# Patient Record
Sex: Female | Born: 1965 | Hispanic: Yes | Marital: Married | Smoking: Never smoker
Health system: Southern US, Community
[De-identification: ages and names within clinical notes are randomized; demographics above are authoritative.]

## PROBLEM LIST (undated history)

## (undated) DIAGNOSIS — J45909 Unspecified asthma, uncomplicated: Secondary | ICD-10-CM

## (undated) HISTORY — PX: EYE SURGERY: SHX253

## (undated) HISTORY — PX: HEMORROIDECTOMY: SUR656

---

## 2014-03-27 DIAGNOSIS — K649 Unspecified hemorrhoids: Secondary | ICD-10-CM | POA: Insufficient documentation

## 2014-04-28 ENCOUNTER — Ambulatory Visit: Payer: Self-pay | Admitting: Family Medicine

## 2014-04-28 IMAGING — MG MM DIGITAL SCREENING BILAT W/ TOMO W/ CAD
8 series · 8 of 8 positions shown · non-contrast
Comparison: None.

CLINICAL DATA: Screening.

EXAM:
DIGITAL SCREENING BILATERAL MAMMOGRAM WITH 3D TOMO WITH CAD

[R MLO]
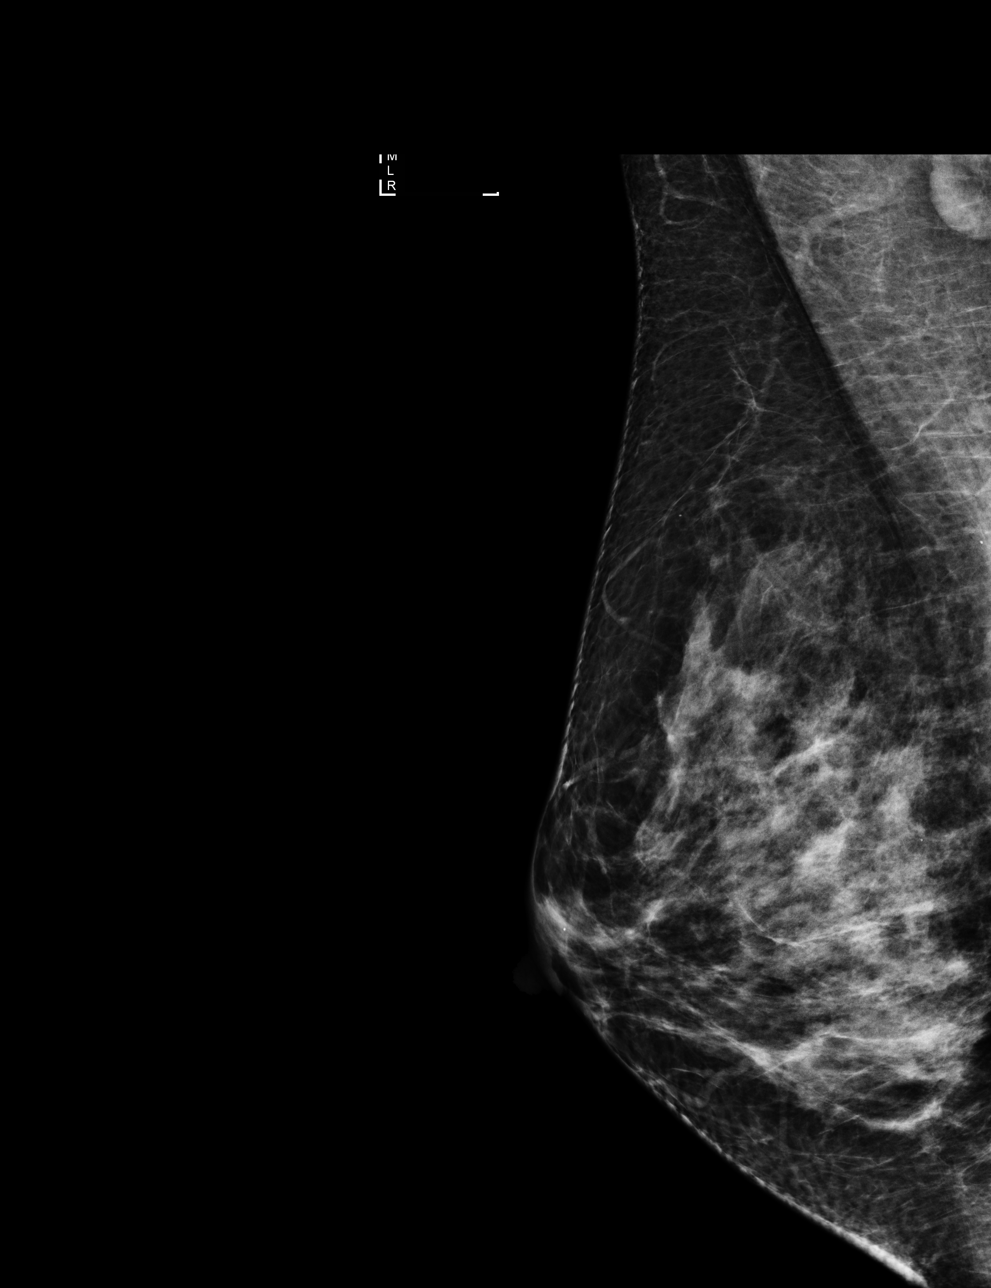

[R CC]
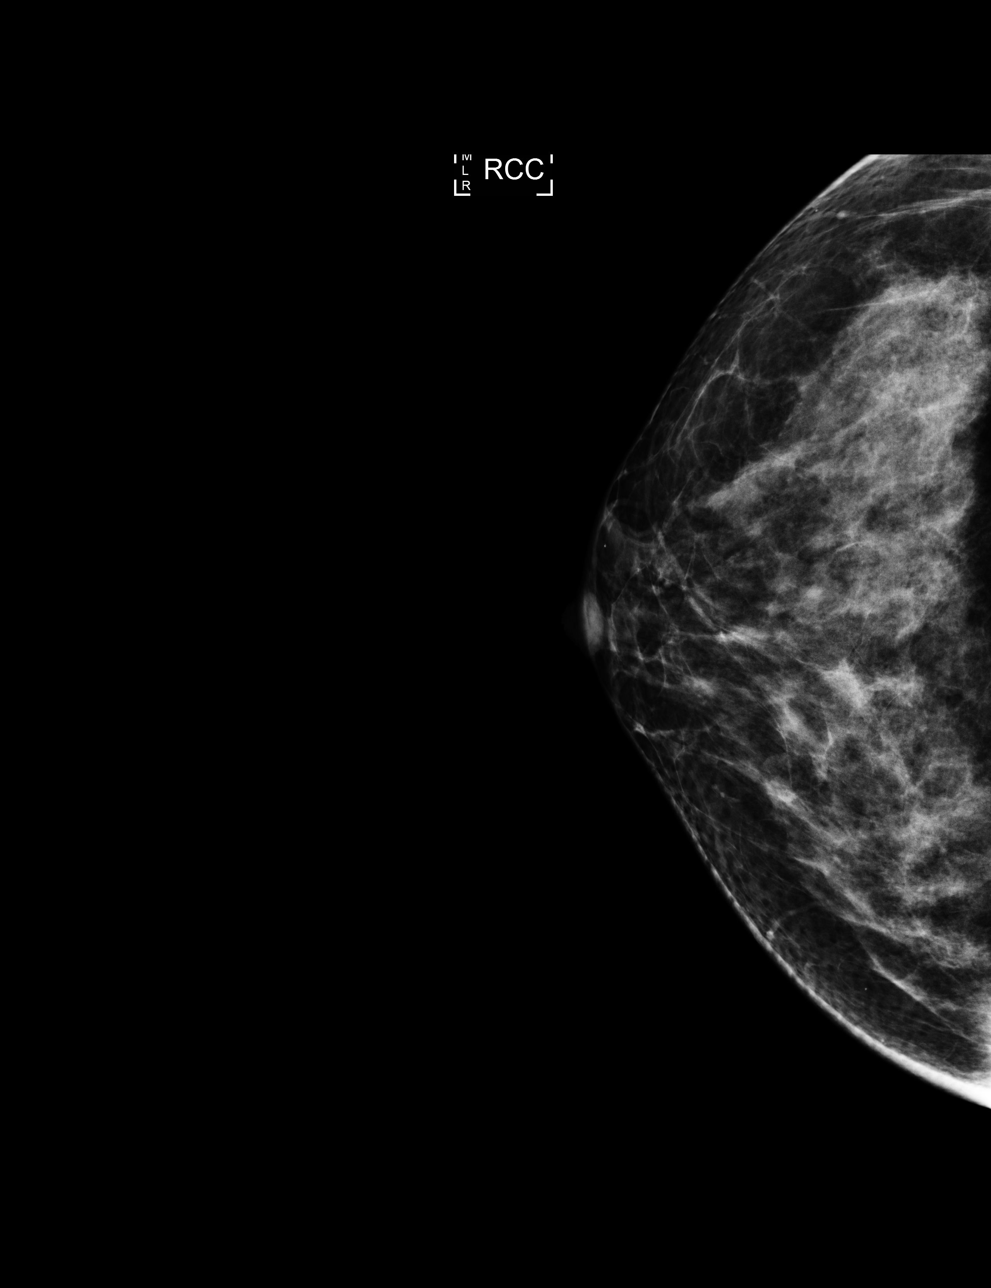

[L CC]
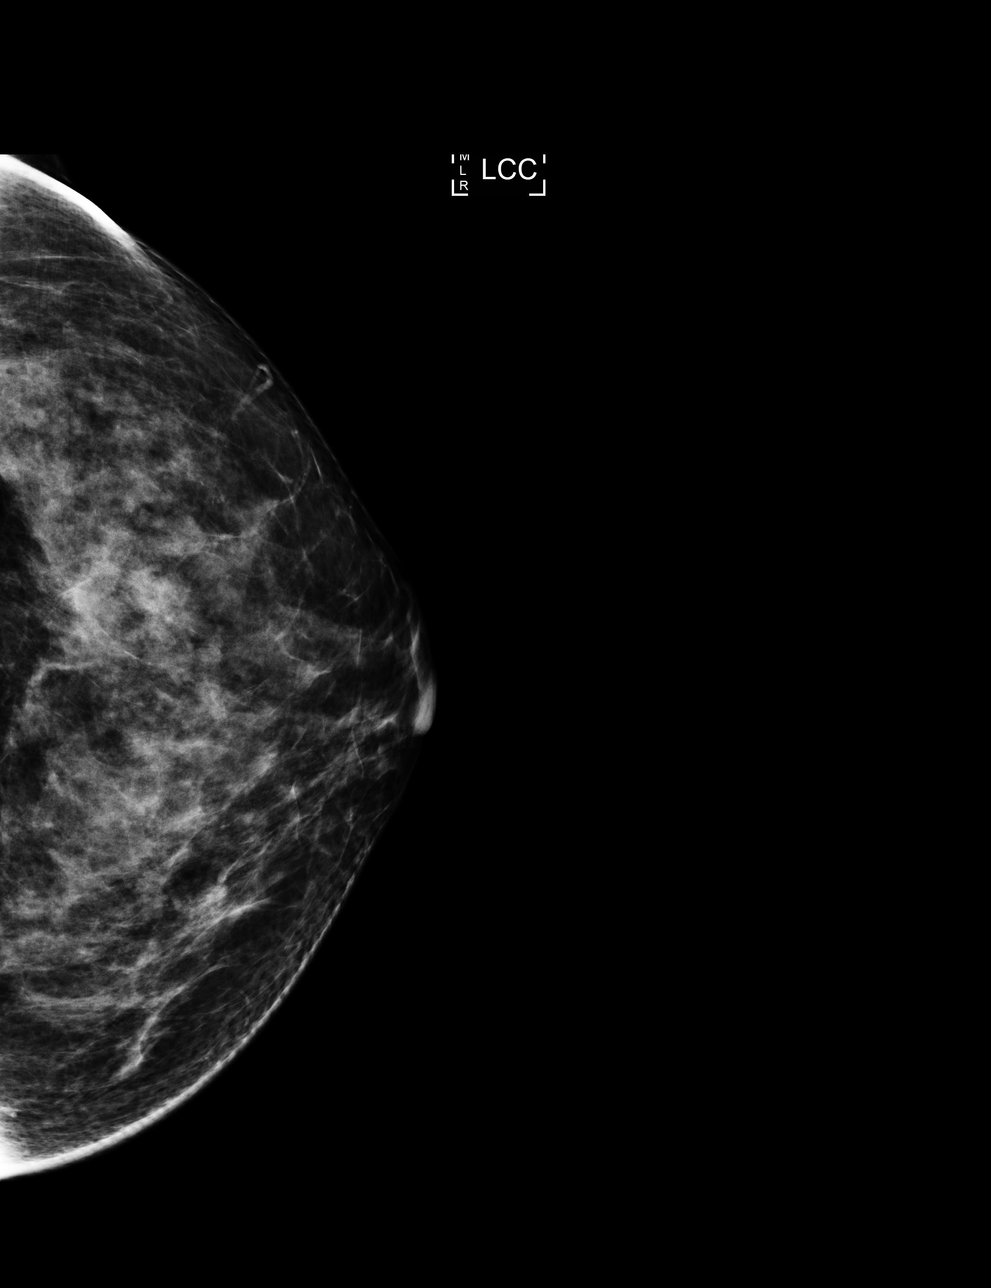

[L MLO]
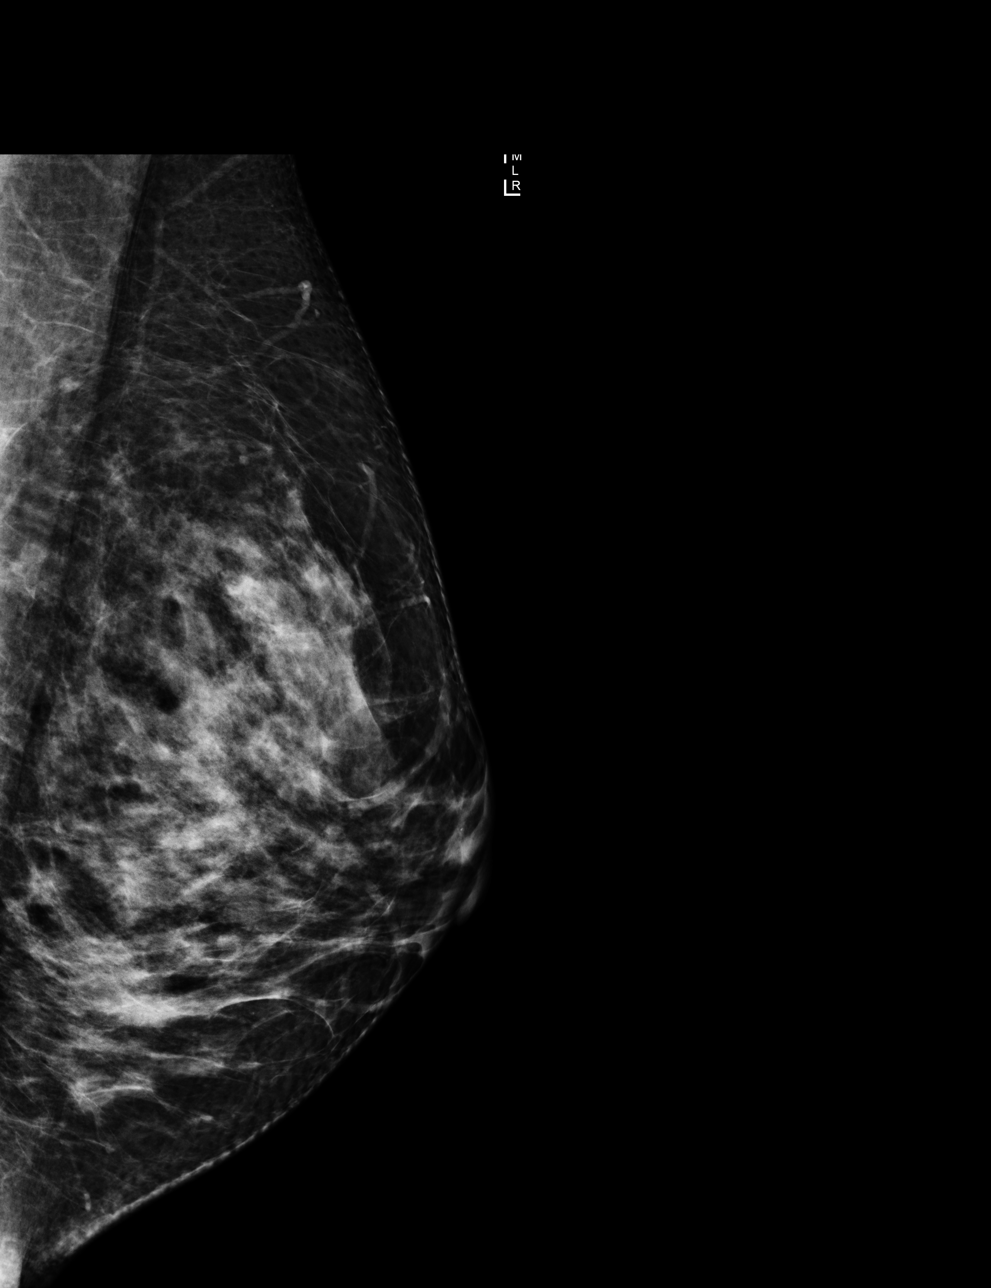

[L CC tomo]
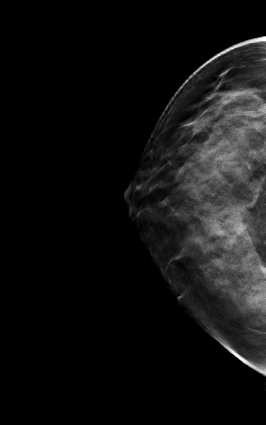

[L MLO tomo]
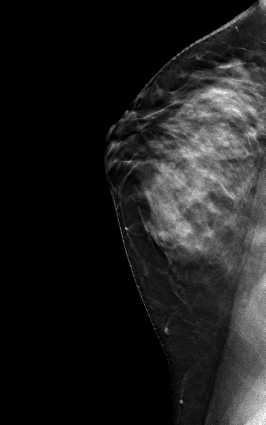

[R CC tomo]
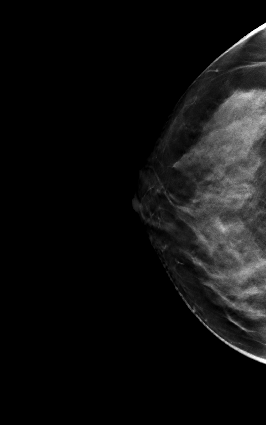

[R MLO tomo]
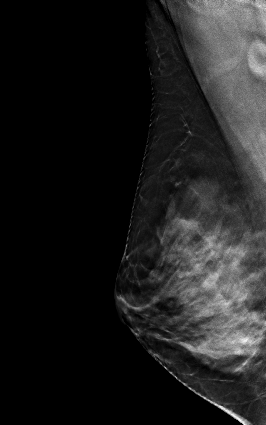

[8 of 8 positions shown; findings below may reference images not displayed]

ACR Breast Density Category d: The breast tissue is extremely dense,
which lowers the sensitivity of mammography.
FINDINGS: There are no findings suspicious for malignancy. Images were
processed with CAD.
IMPRESSION: No mammographic evidence of malignancy. A result letter of this
screening mammogram will be mailed directly to the patient.

RECOMMENDATION:
Screening mammogram in one year. (Code:[L4])

BI-RADS CATEGORY  1: Negative.

## 2014-06-18 ENCOUNTER — Ambulatory Visit: Payer: Self-pay | Admitting: Surgery

## 2014-06-25 ENCOUNTER — Ambulatory Visit: Payer: Self-pay | Admitting: Surgery

## 2014-09-29 DIAGNOSIS — R7611 Nonspecific reaction to tuberculin skin test without active tuberculosis: Secondary | ICD-10-CM | POA: Insufficient documentation

## 2014-10-05 ENCOUNTER — Other Ambulatory Visit: Payer: Self-pay | Admitting: Family Medicine

## 2014-11-28 NOTE — Op Note (Signed)
PATIENT NAMEJannett, [name] MR#:  [id number] DATE OF BIRTH:  05/30/1966  DATE OF PROCEDURE:  06/25/2014  PREOPERATIVE DIAGNOSIS: Internal and external hemorrhoids.   POSTOPERATIVE DIAGNOSIS: Internal and external hemorrhoids.   PROCEDURE: Internal and external hemorrhoidectomy.   SURGEON: Rochel Brome, MD  ANESTHESIA: General.   INDICATIONS: This 49 year old female has a long history of hemorrhoids with history of pain, bleeding and prolapse. Physical examination demonstrated large internal and external hemorrhoids and surgery was recommended for definitive treatment.   DESCRIPTION OF PROCEDURE: The patient was placed on the operating table in the supine position under general anesthesia. Legs were elevated into the lithotomy position using ankle straps. The anal area was prepared with Betadine solution and draped with sterile towels and sheets.   Initial inspection revealed there were multiple large external hemorrhoids. The anoderm and deeper tissues surrounding the sphincter mechanism were infiltrated with 10 mL of Exparel. The anal canal was dilated initially with finger dilatation. There was no palpable rectal mass. The bivalved anal retractor was introduced demonstrating large internal hemorrhoids as well. No neoplasm was seen.   Initially, internal and external hemorrhoids were removed at the 2 o'clock, 4 o'clock, 8 o'clock and 11 o'clock positions. These were 4 separate excisions with a high ligation of the internal component with 2-0 chromic suture ligature. A V-shaped incision was scored externally. The dissection was begun with electrocautery and then the hemorrhoid was excised with the Harmonic scalpel. I could palpate the location of the internal anal sphincter, which was spared. Each of these 4 hemorrhoids were submitted in formalin for routine pathology. Each of these wounds were closed with running 2-0 chromic lock-tied stitches with small opening left externally for drainage. There  was another hemorrhoid at the 3 o'clock position, which was internal, which was ligated with 2-0 chromic. There was another internal hemorrhoid at the 6 o'clock position, which was excised with Harmonic scalpel, and the wound repaired with running 3-0 chromic. There were 2 additional external skin tags, which were excised, which were anterior and to the patient's left. Each of these wounds were repaired with simple 3-0 chromic.   There was some moderate bleeding during the course of the procedure and hemostasis was subsequently intact. The site was further prepared with Betadine solution and injected another 10 mL of Exparel, both in the subcutaneous tissues and also deeper tissues surrounding the sphincter.  Following this, dressings were applied with paper tape. The patient appeared to be in satisfactory condition and was prepared for transfer to the recovery room.  ____________________________ Lenna Sciara. Rochel Brome, MD [email or web address] D: 06/25/2014 09:11:19 ET T: 06/25/2014 12:21:54 ET JOB#: [id number]  cc: Loreli Dollar, MD, <Dictator> Loreli Dollar MD ELECTRONICALLY SIGNED 06/30/2014 9:07

## 2014-11-30 LAB — SURGICAL PATHOLOGY

## 2015-09-17 ENCOUNTER — Ambulatory Visit
Admission: EM | Admit: 2015-09-17 | Discharge: 2015-09-17 | Disposition: A | Discharge status: Home / Self Care | Payer: BLUE CROSS/BLUE SHIELD | Attending: Family Medicine | Admitting: Family Medicine

## 2015-09-17 DIAGNOSIS — J069 Acute upper respiratory infection, unspecified: Secondary | ICD-10-CM

## 2015-09-17 LAB — RAPID STREP SCREEN (MED CTR MEBANE ONLY): STREPTOCOCCUS, GROUP A SCREEN (DIRECT): NEGATIVE

## 2015-09-17 LAB — RAPID INFLUENZA A&B ANTIGENS
Influenza A (ARMC): NOT DETECTED
Influenza B (ARMC): NOT DETECTED

## 2015-09-17 MED ORDER — AZITHROMYCIN 250 MG PO TABS: ORAL_TABLET | ORAL | Status: DC

## 2015-09-17 MED ORDER — NAPROXEN 500 MG PO TABS: 500.0000 mg | ORAL_TABLET | Freq: Two times a day (BID) | ORAL | Status: DC

## 2015-09-17 MED ORDER — FLUTICASONE PROPIONATE 50 MCG/ACT NA SUSP: 2.0000 | Freq: Every day | NASAL | Status: DC

## 2015-09-17 MED ORDER — HYDROCOD POLST-CPM POLST ER 10-8 MG/5ML PO SUER: 5.0000 mL | Freq: Two times a day (BID) | ORAL | Status: DC

## 2015-09-17 NOTE — Discharge Instructions (Signed)
Cool Mist Vaporizers °Vaporizers may help relieve the symptoms of a cough and cold. They add moisture to the air, which helps mucus to become thinner and less sticky. This makes it easier to breathe and cough up secretions. Cool mist vaporizers do not cause serious burns like hot mist vaporizers, which may also be called steamers or humidifiers. Vaporizers have not been proven to help with colds. You should not use a vaporizer if you are allergic to mold. °HOME CARE INSTRUCTIONS °· Follow the package instructions for the vaporizer. °· Do not use anything other than distilled water in the vaporizer. °· Do not run the vaporizer all of the time. This can cause mold or bacteria to grow in the vaporizer. °· Clean the vaporizer after each time it is used. °· Clean and dry the vaporizer well before storing it. °· Stop using the vaporizer if worsening respiratory symptoms develop. °  °This information is not intended to replace advice given to you by your health care provider. Make sure you discuss any questions you have with your health care provider. °  °Document Released: 04/20/2004 Document Revised: 07/29/2013 Document Reviewed: 12/11/2012 °Elsevier Interactive Patient Education ©2016 [employer] ° °Upper Respiratory Infection, Adult °Most upper respiratory infections (URIs) are a viral infection of the air passages leading to the lungs. A URI affects the nose, throat, and upper air passages. The most common type of URI is nasopharyngitis and is typically referred to as "the common cold." °URIs run their course and usually go away on their own. Most of the time, a URI does not require medical attention, but sometimes a bacterial infection in the upper airways can follow a viral infection. This is called a secondary infection. Sinus and middle ear infections are common types of secondary upper respiratory infections. °Bacterial pneumonia can also complicate a URI. A URI can worsen asthma and chronic obstructive  pulmonary disease (COPD). Sometimes, these complications can require emergency medical care and may be life threatening.  °CAUSES °Almost all URIs are caused by viruses. A virus is a type of germ and can spread from one person to another.  °RISKS FACTORS °You may be at risk for a URI if:  °· You smoke.   °· You have chronic heart or lung disease. °· You have a weakened defense (immune) system.   °· You are very young or very old.   °· You have nasal allergies or asthma. °· You work in crowded or poorly ventilated areas. °· You work in health care facilities or schools. °SIGNS AND SYMPTOMS  °Symptoms typically develop 2-3 days after you come in contact with a cold virus. Most viral URIs last 7-10 days. However, viral URIs from the influenza virus (flu virus) can last 14-18 days and are typically more severe. Symptoms may include:  °· Runny or stuffy (congested) nose.   °· Sneezing.   °· Cough.   °· Sore throat.   °· Headache.   °· Fatigue.   °· Fever.   °· Loss of appetite.   °· Pain in your forehead, behind your eyes, and over your cheekbones (sinus pain). °· Muscle aches.   °DIAGNOSIS  °Your health care provider may diagnose a URI by: °· Physical exam. °· Tests to check that your symptoms are not due to another condition such as: °¨ Strep throat. °¨ Sinusitis. °¨ Pneumonia. °¨ Asthma. °TREATMENT  °A URI goes away on its own with time. It cannot be cured with medicines, but medicines may be prescribed or recommended to relieve symptoms. Medicines may help: °· Reduce your fever. °· Reduce   your cough. °· Relieve nasal congestion. °HOME CARE INSTRUCTIONS  °· Take medicines only as directed by your health care provider.   °· Gargle warm saltwater or take cough drops to comfort your throat as directed by your health care provider. °· Use a warm mist humidifier or inhale steam from a shower to increase air moisture. This may make it easier to breathe. °· Drink enough fluid to keep your urine clear or pale yellow.   °· Eat  soups and other clear broths and maintain good nutrition.   °· Rest as needed.   °· Return to work when your temperature has returned to normal or as your health care provider advises. You may need to stay home longer to avoid infecting others. You can also use a face mask and careful hand washing to prevent spread of the virus. °· Increase the usage of your inhaler if you have asthma.   °· Do not use any tobacco products, including cigarettes, chewing tobacco, or electronic cigarettes. If you need help quitting, ask your health care provider. °PREVENTION  °The best way to protect yourself from getting a cold is to practice good hygiene.  °· Avoid oral or hand contact with people with cold symptoms.   °· Wash your hands often if contact occurs.   °There is no clear evidence that vitamin C, vitamin E, echinacea, or exercise reduces the chance of developing a cold. However, it is always recommended to get plenty of rest, exercise, and practice good nutrition.  °SEEK MEDICAL CARE IF:  °· You are getting worse rather than better.   °· Your symptoms are not controlled by medicine.   °· You have chills. °· You have worsening shortness of breath. °· You have brown or red mucus. °· You have yellow or brown nasal discharge. °· You have pain in your face, especially when you bend forward. °· You have a fever. °· You have swollen neck glands. °· You have pain while swallowing. °· You have white areas in the back of your throat. °SEEK IMMEDIATE MEDICAL CARE IF:  °· You have severe or persistent: °¨ Headache. °¨ Ear pain. °¨ Sinus pain. °¨ Chest pain. °· You have chronic lung disease and any of the following: °¨ Wheezing. °¨ Prolonged cough. °¨ Coughing up blood. °¨ A change in your usual mucus. °· You have a stiff neck. °· You have changes in your: °¨ Vision. °¨ Hearing. °¨ Thinking. °¨ Mood. °MAKE SURE YOU:  °· Understand these instructions. °· Will watch your condition. °· Will get help right away if you are not doing well or  get worse. °  °This information is not intended to replace advice given to you by your health care provider. Make sure you discuss any questions you have with your health care provider. °  °Document Released: 01/17/2001 Document Revised: 12/08/2014 Document Reviewed: 10/29/2013 °Elsevier Interactive Patient Education ©2016 Elsevier Inc. ° °

## 2015-09-17 NOTE — ED Notes (Signed)
Patient c/o sore throat, cough, nasal congestion and pain, and body aches which started 6 days ago.

## 2015-09-17 NOTE — ED Provider Notes (Signed)
CSN: [id number]     Arrival date & time 09/17/15  [id number] History   First MD Initiated Contact with Patient 09/17/15 1008     Chief Complaint  Patient presents with  . URI   (Consider location/radiation/quality/duration/timing/severity/associated sxs/prior Treatment) HPI   50 year old female who presents with a six-day history of sore throat cough that is productive of yellow sputum and nasal congestion and drainage with pain in the maxillary sinus area and body aches. She did not have her flu shot this year. Both her children are sick at home. Not been febrile and has not had any chills. Her main problem is the pain and pressure in her sinuses and headaches. She's tried numerous over-the-counter remedies which have not been beneficial. Today she is afebrile at 98 pulse is 78 blood pressure 110/71 and O2 sat 100% on room air.  History reviewed. No pertinent past medical history. Past Surgical History  Procedure Laterality Date  . Cesarean section     History reviewed. No pertinent family history. Social History  Substance Use Topics  . Smoking status: Never Smoker   . Smokeless tobacco: Never Used  . Alcohol Use: No   OB History    No data available     Review of Systems  Constitutional: Positive for activity change and fatigue. Negative for fever and chills.  HENT: Positive for congestion, postnasal drip, rhinorrhea, sinus pressure and sore throat.   Respiratory: Positive for cough and shortness of breath.   All other systems reviewed and are negative.   Allergies  Review of patient's allergies indicates no known allergies.  Home Medications   Prior to Admission medications   Medication Sig Start Date End Date Taking? Authorizing Provider  azithromycin (ZITHROMAX Z-PAK) 250 MG tablet Use as per package instructions 09/17/15   Lorin Picket, PA-C  chlorpheniramine-HYDROcodone [facility]) 10-8 MG/5ML SUER Take 5 mLs by mouth 2 (two) times daily. 09/17/15    Lorin Picket, PA-C  fluticasone (FLONASE) 50 MCG/ACT nasal spray Place 2 sprays into both nostrils daily. 09/17/15   Lorin Picket, PA-C  naproxen (NAPROSYN) 500 MG tablet Take 1 tablet (500 mg total) by mouth 2 (two) times daily with a meal. 09/17/15   Lorin Picket, PA-C   Meds Ordered and Administered this Visit  Medications - No data to display  BP 110/71 mmHg  Pulse 78  Temp(Src) 98 F (36.7 C) (Oral)  Resp 16  Ht 5\' 2"  (1.575 m)  Wt 130 lb (58.968 kg)  BMI 23.77 kg/m2  SpO2 100%  LMP 09/06/2015 No data found.   Physical Exam  Constitutional: She is oriented to person, place, and time. She appears well-developed and well-nourished. No distress.  HENT:  Head: Normocephalic and atraumatic.  Right Ear: External ear normal.  Left Ear: External ear normal.  Nose: Nose normal.  Mouth/Throat: Oropharynx is clear and moist. No oropharyngeal exudate.  Eyes: Conjunctivae are normal. Pupils are equal, round, and reactive to light.  Neck: Normal range of motion. Neck supple.  Pulmonary/Chest: Breath sounds normal. No respiratory distress. She has no wheezes. She has no rales.  Musculoskeletal: Normal range of motion. She exhibits no edema or tenderness.  Lymphadenopathy:    She has no cervical adenopathy.  Neurological: She is alert and oriented to person, place, and time.  Skin: Skin is warm and dry. She is not diaphoretic.  Psychiatric: She has a normal mood and affect. Her behavior is normal. Judgment and thought content normal.  Nursing note  and vitals reviewed.   ED Course  Procedures (including critical care time)  Labs Review Labs Reviewed  RAPID STREP SCREEN (NOT AT [facility])  RAPID INFLUENZA A&B ANTIGENS ([facility] ONLY)  CULTURE, GROUP A STREP [facility])    Imaging Review No results found.   Visual Acuity Review  Right Eye Distance:   Left Eye Distance:   Bilateral Distance:    Right Eye Near:   Left Eye Near:    Bilateral Near:         MDM   1.  Acute URI    New Prescriptions   AZITHROMYCIN (ZITHROMAX Z-PAK) 250 MG TABLET    Use as per package instructions   CHLORPHENIRAMINE-HYDROCODONE (TUSSIONEX PENNKINETIC ER) 10-8 MG/5ML SUER    Take 5 mLs by mouth 2 (two) times daily.   FLUTICASONE (FLONASE) 50 MCG/ACT NASAL SPRAY    Place 2 sprays into both nostrils daily.   NAPROXEN (NAPROSYN) 500 MG TABLET    Take 1 tablet (500 mg total) by mouth 2 (two) times daily with a meal.  Plan: 1. Test/x-ray results and diagnosis reviewed with patient 2. rx as per orders; risks, benefits, potential side effects reviewed with patient 3. Recommend supportive treatment with fluids and rest. Keep off for 1 day and allow her to stay and then return to work the following day. 4. F/u prn if symptoms worsen or don't improve     Lorin Picket, PA-C 09/17/15 1127

## 2015-09-19 LAB — CULTURE, GROUP A STREP (THRC)

## 2016-11-06 ENCOUNTER — Other Ambulatory Visit: Payer: Self-pay | Admitting: Family Medicine

## 2016-11-06 DIAGNOSIS — Z1231 Encounter for screening mammogram for malignant neoplasm of breast: Secondary | ICD-10-CM

## 2016-11-13 ENCOUNTER — Ambulatory Visit: Admission: RE | Admit: 2016-11-13 | Payer: BLUE CROSS/BLUE SHIELD | Source: Ambulatory Visit

## 2016-11-15 ENCOUNTER — Ambulatory Visit
Admission: RE | Admit: 2016-11-15 | Discharge: 2016-11-15 | Disposition: A | Discharge status: Home / Self Care | Payer: BLUE CROSS/BLUE SHIELD | Source: Ambulatory Visit | Attending: Family Medicine | Admitting: Family Medicine

## 2016-11-15 ENCOUNTER — Encounter: Payer: Self-pay | Admitting: Radiology

## 2016-11-15 DIAGNOSIS — Z1231 Encounter for screening mammogram for malignant neoplasm of breast: Secondary | ICD-10-CM | POA: Diagnosis present

## 2016-11-15 IMAGING — MG MM DIGITAL SCREENING BILAT W/ CAD
4 series · 4 of 4 positions shown · non-contrast
Comparison: Previous exam(s).

CLINICAL DATA: Screening.

EXAM:
DIGITAL SCREENING BILATERAL MAMMOGRAM WITH CAD

[L MLO]
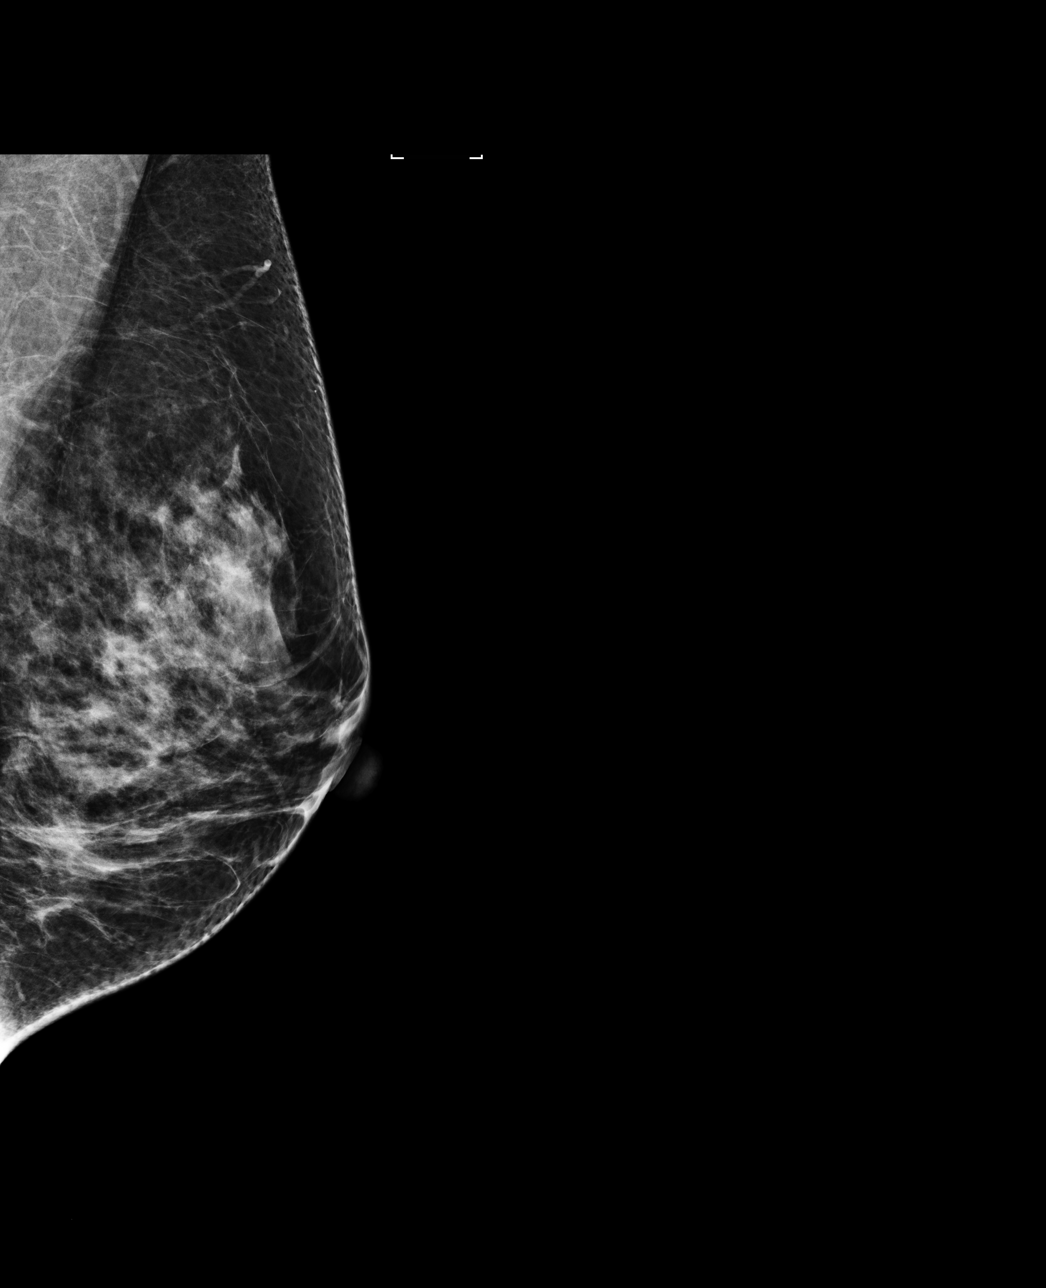

[R MLO]
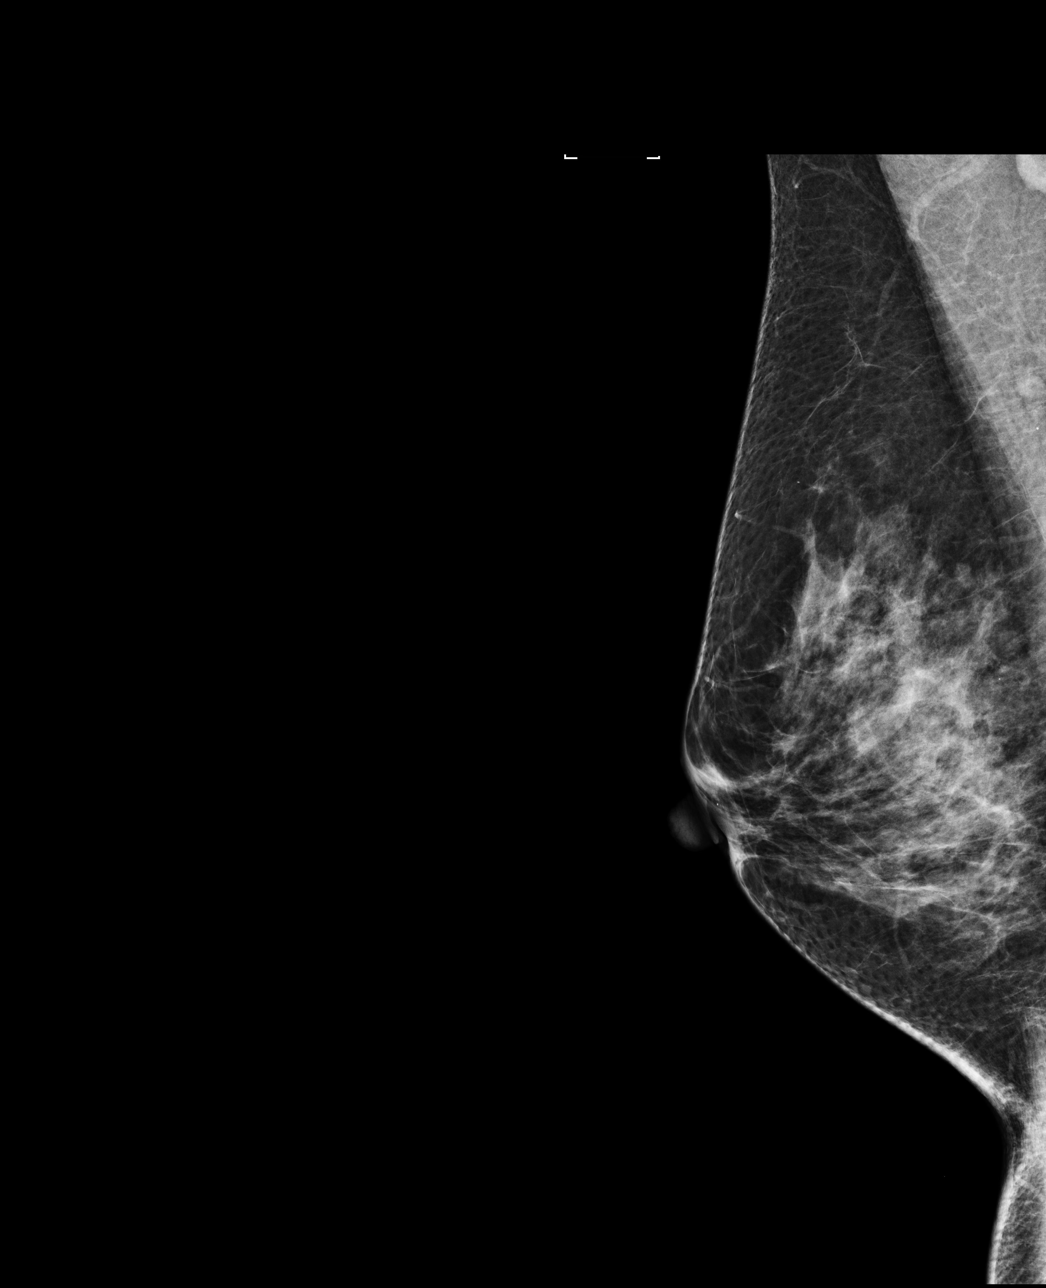

[L CC]
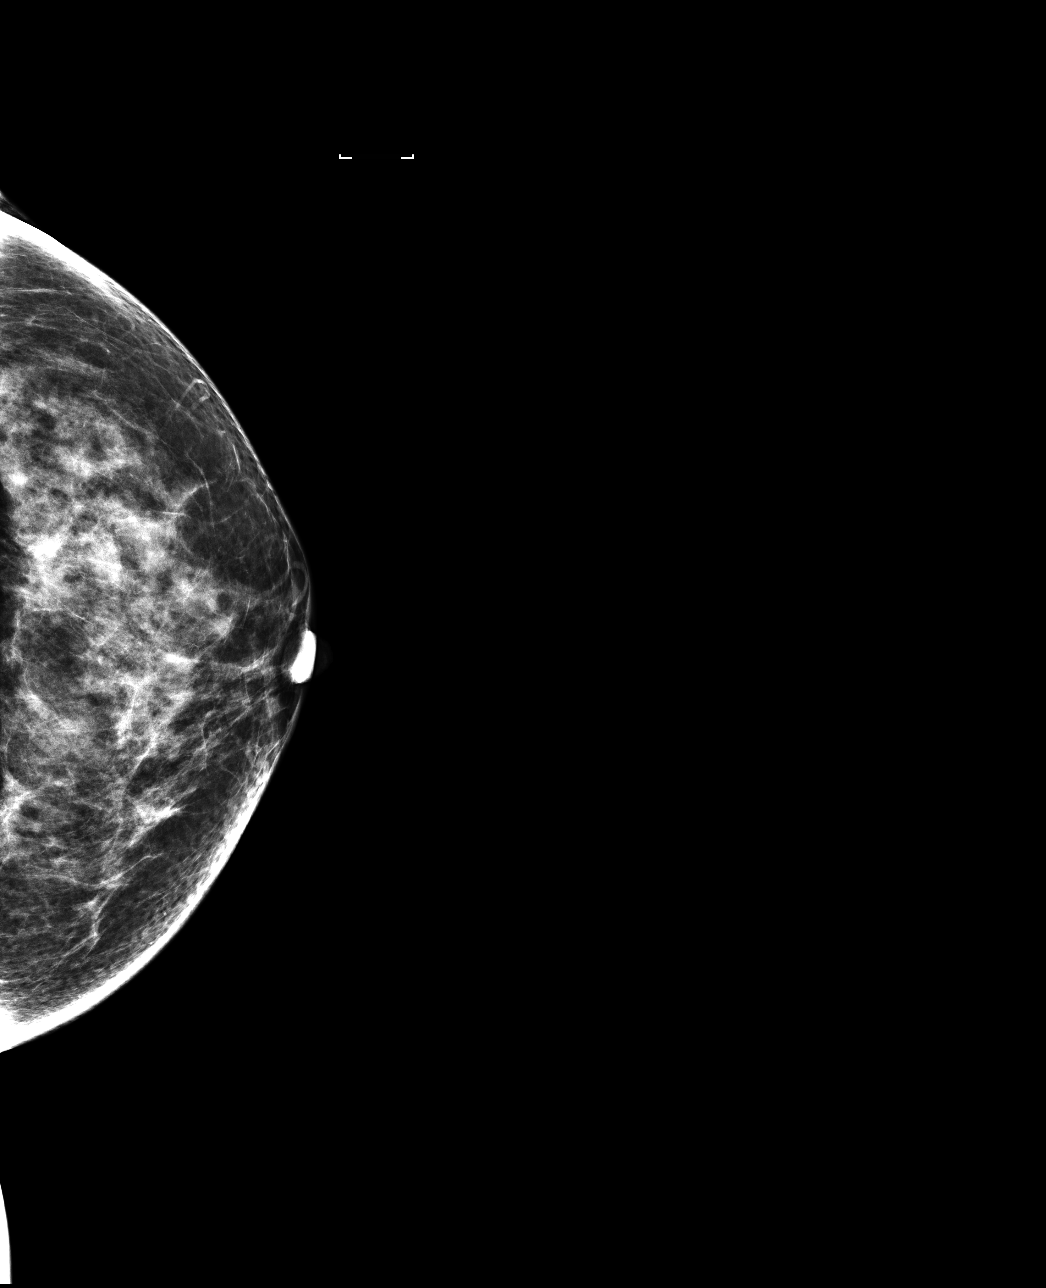

[R CC]
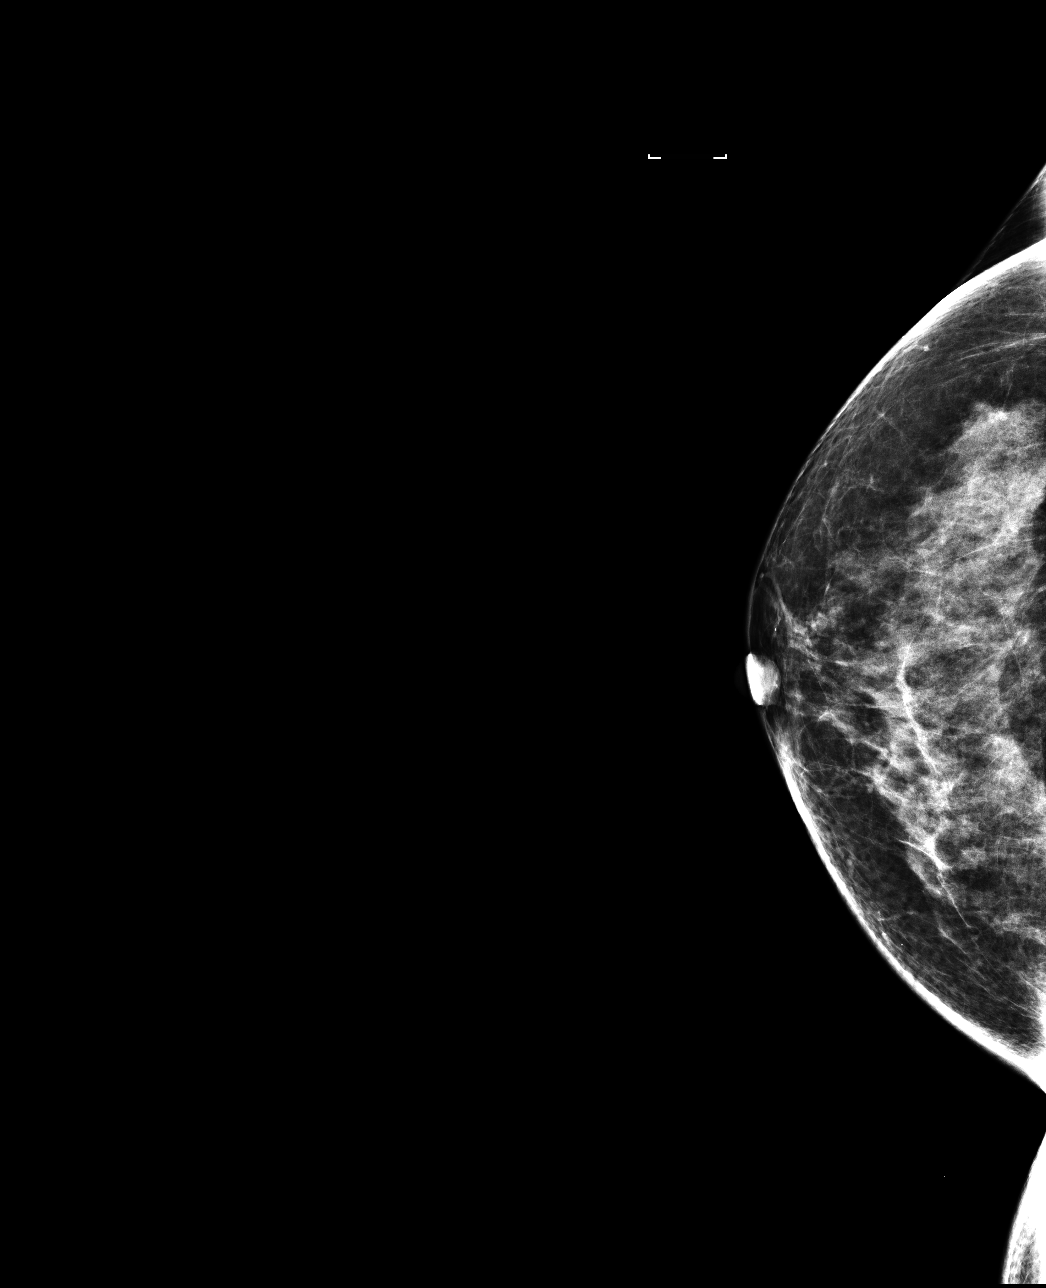

[4 of 4 positions shown; findings below may reference images not displayed]

ACR Breast Density Category c: The breast tissue is heterogeneously
dense, which may obscure small masses.
FINDINGS: There are no findings suspicious for malignancy. Images were
processed with CAD.
IMPRESSION: No mammographic evidence of malignancy. A result letter of this
screening mammogram will be mailed directly to the patient.

RECOMMENDATION:
Screening mammogram in one year. (Code:[0J])

BI-RADS CATEGORY  1: Negative.

## 2017-02-09 ENCOUNTER — Encounter: Payer: Self-pay | Admitting: Emergency Medicine

## 2017-02-09 ENCOUNTER — Ambulatory Visit (INDEPENDENT_AMBULATORY_CARE_PROVIDER_SITE_OTHER): Payer: Worker's Compensation

## 2017-02-09 ENCOUNTER — Ambulatory Visit
Admission: EM | Admit: 2017-02-09 | Discharge: 2017-02-09 | Disposition: A | Discharge status: Home / Self Care | Payer: Worker's Compensation | Attending: Family Medicine | Admitting: Family Medicine

## 2017-02-09 DIAGNOSIS — S39012A Strain of muscle, fascia and tendon of lower back, initial encounter: Secondary | ICD-10-CM

## 2017-02-09 DIAGNOSIS — X500XXA Overexertion from strenuous movement or load, initial encounter: Secondary | ICD-10-CM | POA: Diagnosis not present

## 2017-02-09 IMAGING — CR DG LUMBAR SPINE COMPLETE 4+V
6 series · 6 of 6 positions shown · non-contrast
Comparison: None.

CLINICAL DATA: Low back pain following heavy lifting, initial
encounter

EXAM:
LUMBAR SPINE - COMPLETE 4+ VIEW

[l-spine ap]
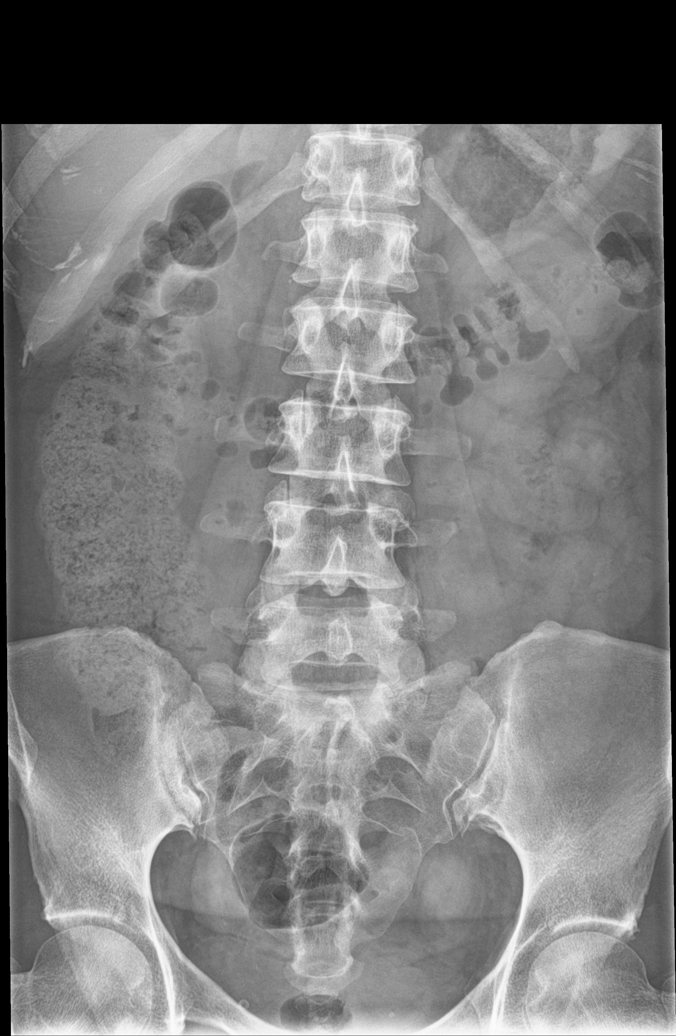

[l-spine obl (1 of 2)]
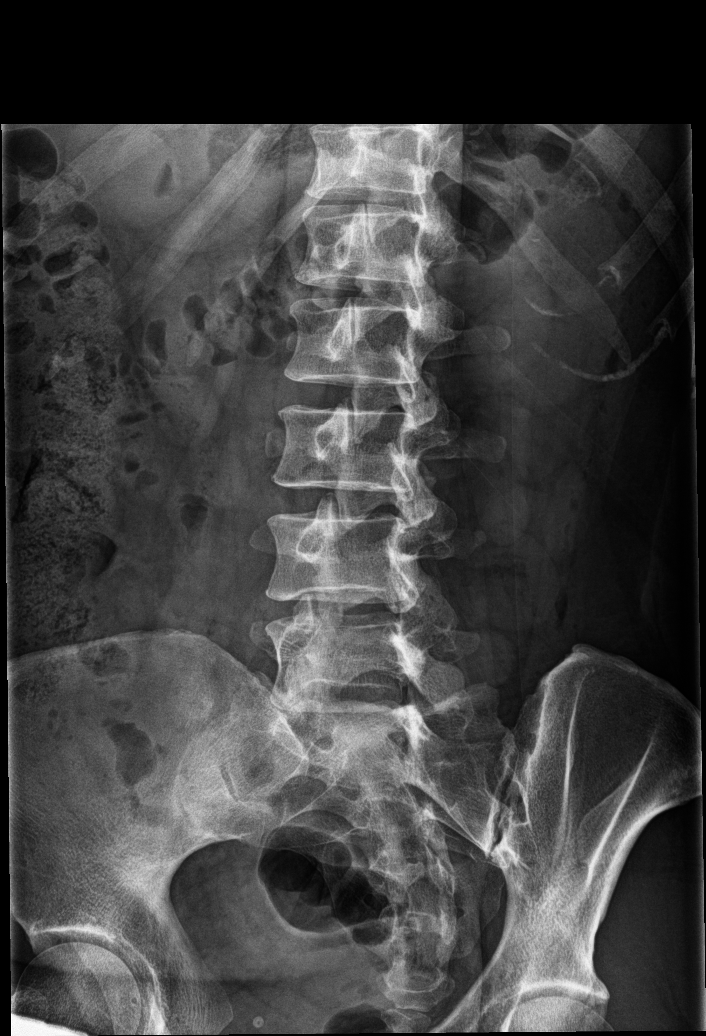

[l-spine obl (2 of 2)]
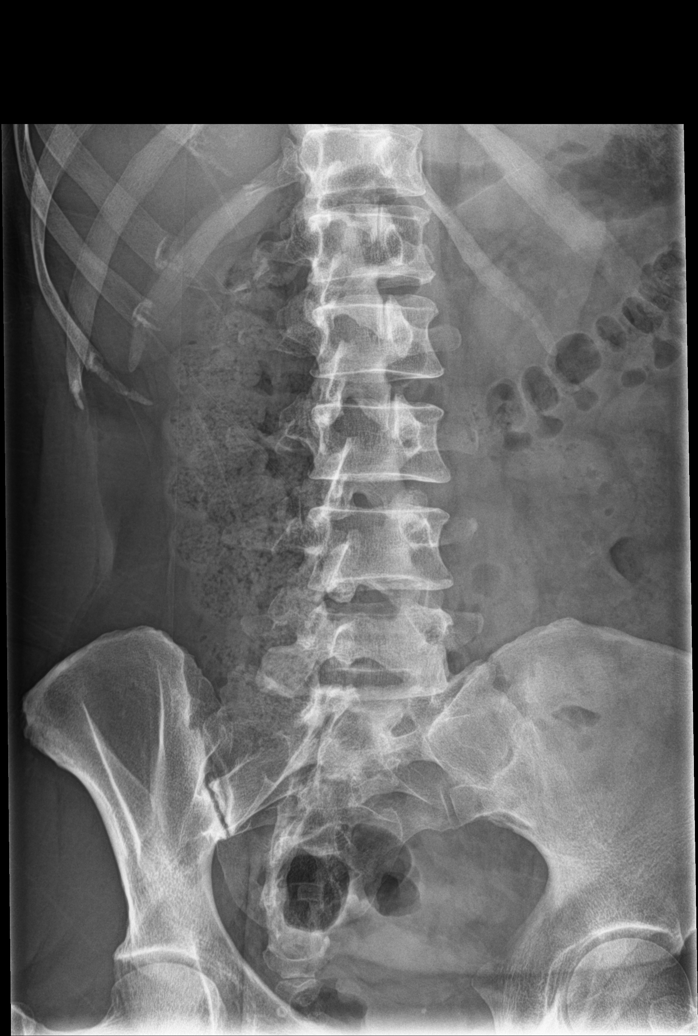

[l-spine lat]
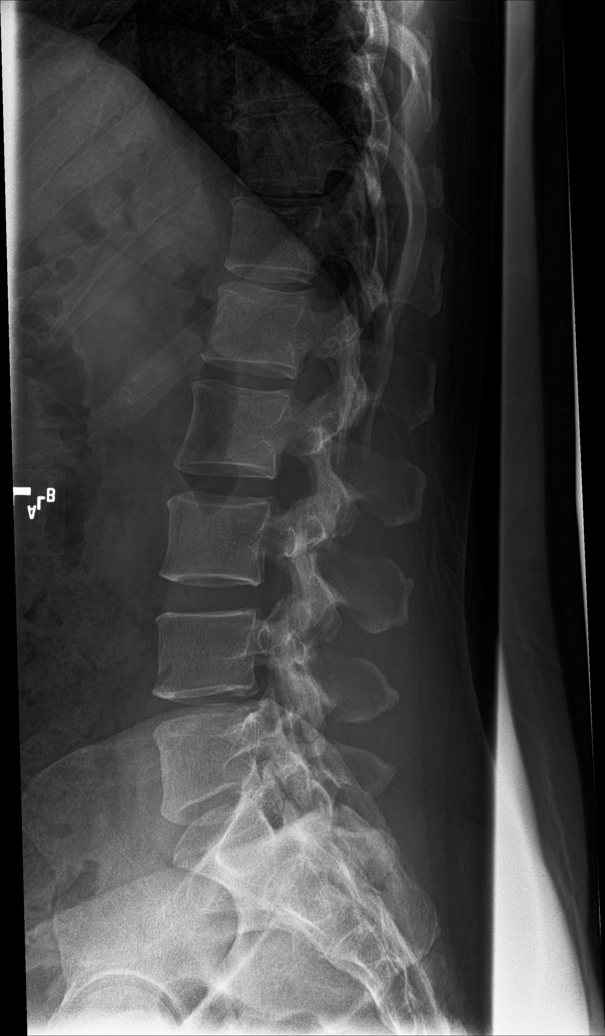

[l-spine spot (1 of 2)]
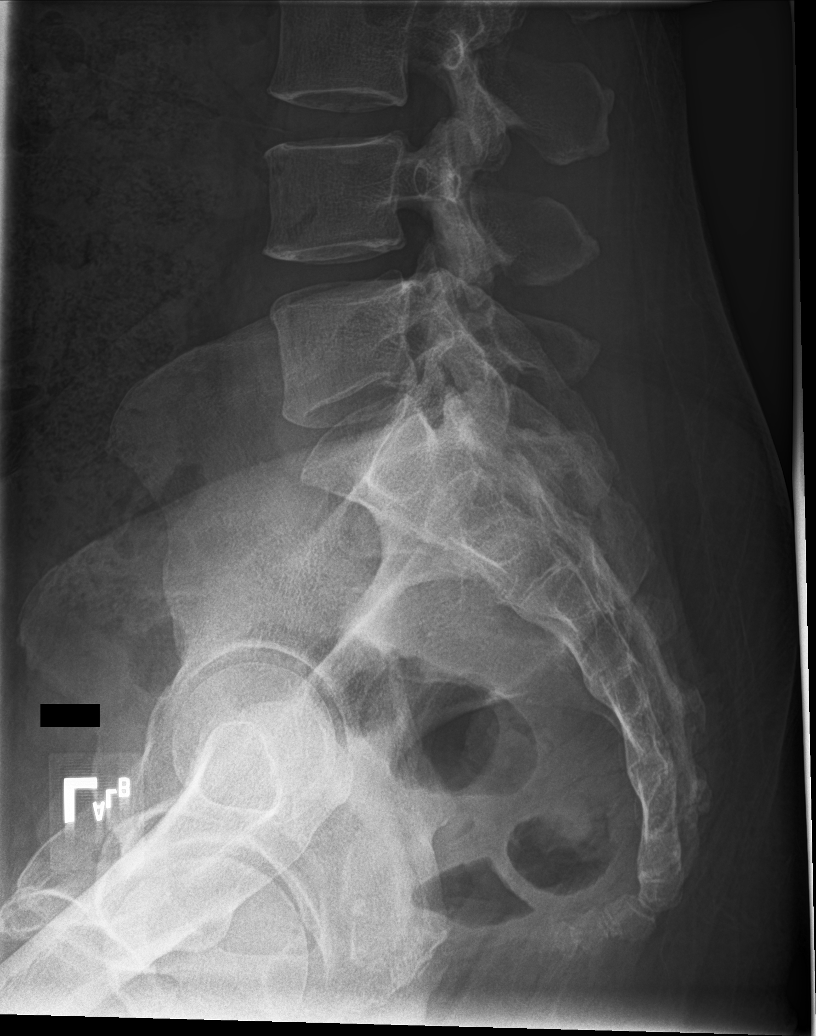

[l-spine spot (2 of 2)]
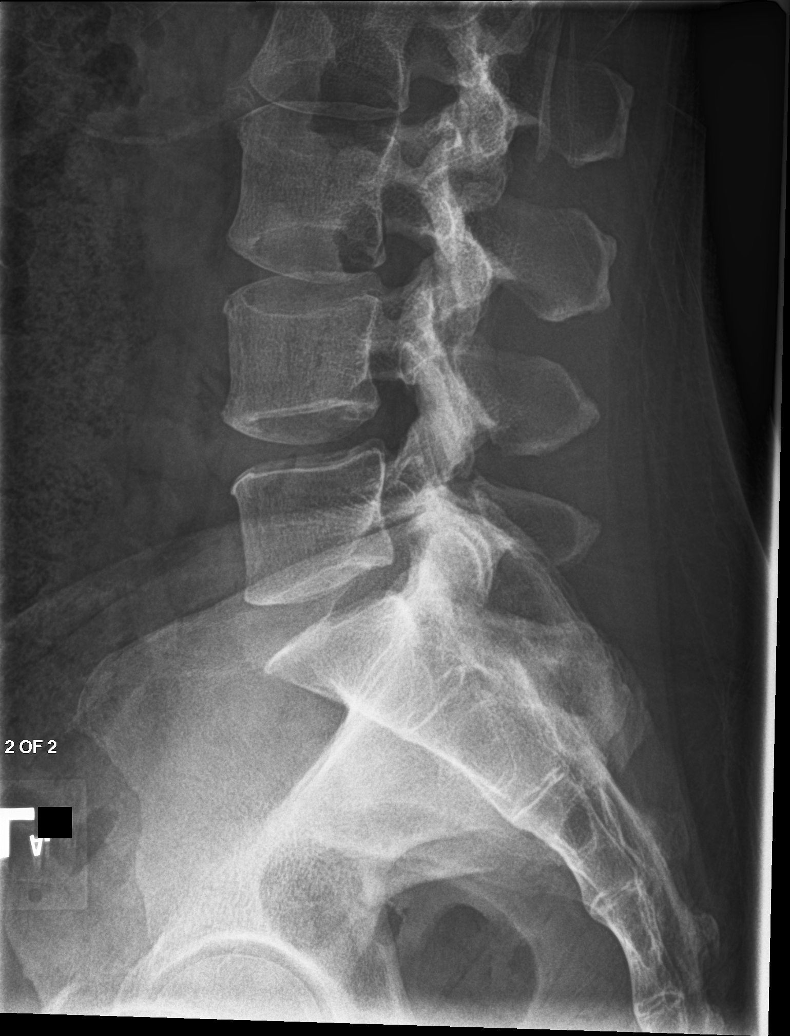

[6 of 6 positions shown; findings below may reference images not displayed]

FINDINGS: Five lumbar type vertebral bodies are well visualized. Vertebral
body height is well maintained. No soft tissue abnormality is seen.
No pars defects are noted. No anterolisthesis is seen. Mild disc
space narrowing is noted at L4-5.
IMPRESSION: Mild degenerative change without acute abnormality.

## 2017-02-09 MED ORDER — KETOROLAC TROMETHAMINE 60 MG/2ML IM SOLN
60.0000 mg | Freq: Once | INTRAMUSCULAR | Status: AC
  Administered 2017-02-09: 60 mg via INTRAMUSCULAR

## 2017-02-09 MED ORDER — NAPROXEN 500 MG PO TABS: 500.0000 mg | ORAL_TABLET | Freq: Two times a day (BID) | ORAL | 0 refills | Status: DC

## 2017-02-09 MED ORDER — DIAZEPAM 2 MG PO TABS: 2.0000 mg | ORAL_TABLET | Freq: Three times a day (TID) | ORAL | 0 refills | Status: DC

## 2017-02-09 MED ORDER — METAXALONE 800 MG PO TABS: 800.0000 mg | ORAL_TABLET | Freq: Three times a day (TID) | ORAL | 0 refills | Status: DC

## 2017-02-09 NOTE — ED Triage Notes (Signed)
Patient states that she tried lifting heavy furniture boxes at [employer] yesterday.  Patient states that she felt something pop in her lower back and had sudden pain in her back.

## 2017-02-09 NOTE — ED Provider Notes (Signed)
CSN: [id number]     Arrival date & time 02/09/17  [id number] History   First MD Initiated Contact with Patient 02/09/17 1040     Chief Complaint  Patient presents with  . Back Pain   (Consider location/radiation/quality/duration/timing/severity/associated sxs/prior Treatment) HPI  51 year old female who presents with a Worker's Compensation injury that she sustained at [employer] on 02/08/2017. 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 She was on her knees when she starting to lift the furniture got to approximately 12-18 inches off the floor and felt a pop in her right sided lower back. Now she has nonradiating low back pain. Has had some tingling in her  feet and legs but in a nondermatomal distribution. She's had no incontinence. She does state that she has not been able to pass gas recently. She's had no previous back injuries. When first seen the patient was unable to participate with any examination and stated she was unable to stand or walk. At home she has been using ibuprofen and ice on her lumbar spine but without much success.       History reviewed. No pertinent past medical history. Past Surgical History:  Procedure Laterality Date  . CESAREAN SECTION     Family History  Problem Relation Age of Onset  . Healthy Mother   . Healthy Father   . Breast cancer Neg Hx    Social History  Substance Use Topics  . Smoking status: Never Smoker  . Smokeless tobacco: Never Used  . Alcohol use No   OB History    No data available     Review of Systems  Constitutional: Positive for activity change. Negative for appetite change, chills, fatigue and fever.  Musculoskeletal: Positive for back pain, gait problem and myalgias.  All other systems reviewed and are negative.   Allergies  Patient has no known allergies.  Home Medications   Prior to Admission medications   Medication Sig Start Date End Date Taking? Authorizing Provider  diazepam (VALIUM) 2 MG  tablet Take 1 tablet (2 mg total) by mouth 3 (three) times daily. 02/09/17   Lorin Picket, PA-C  metaxalone (SKELAXIN) 800 MG tablet Take 1 tablet (800 mg total) by mouth 3 (three) times daily. Start taking after finishing the Valium for muscle spasm 02/09/17   Lorin Picket, PA-C  naproxen (NAPROSYN) 500 MG tablet Take 1 tablet (500 mg total) by mouth 2 (two) times daily with a meal. 02/09/17   Lorin Picket, PA-C   Meds Ordered and Administered this Visit   Medications  ketorolac (TORADOL) injection 60 mg (60 mg Intramuscular Given 02/09/17 1014)  Following Toradol injection the patient had much less pain and was much more cooperative with the examination.  BP 110/62 (BP Location: Left Arm)   Pulse 72   Temp 98.1 F (36.7 C) (Oral)   Resp 16   Ht 5\' 2"  (1.575 m)   Wt 150 lb (68 kg)   LMP 09/06/2015 Comment: deneis preg, signed preg waiver  SpO2 100%   BMI 27.44 kg/m  No data found.   Physical Exam  Constitutional: She is oriented to person, place, and time. She appears well-developed and well-nourished. No distress.  HENT:  Head: Normocephalic.  Eyes: Pupils are equal, round, and reactive to light.  Neck: Normal range of motion.  Abdominal: Soft. Bowel sounds are normal. She exhibits no distension. There is no tenderness. There is no rebound and no guarding.  Musculoskeletal:  Patient has  extreme difficulty standing from a sitting position. She is unable to assume a recumbent position due to severe right-sided low back pain. Patient cries with almost any of positional change. She has a hip esthesia to light touch in an L4 distribution on the right. She has normal EHL anterior tibialis and perineal muscle function that is strong. DTRs are slightly hyperactive at 3 over 4 bilaterally at the knee and ankle; there is no clonus present. SLR Testing shows a positive on the left at approximately 45 with further cross positive pain in her right lumbar spine; is negative on the right.  She is tender in the paraspinous muscles from L2 through L5 in the sitting position as she was unable to become recumbent.  Neurological: She is alert and oriented to person, place, and time.  Skin: Skin is warm and dry. She is not diaphoretic.  Psychiatric: She has a normal mood and affect. Her behavior is normal. Judgment and thought content normal.  Nursing note and vitals reviewed.   Urgent Care Course     Procedures (including critical care time)  Labs Review Labs Reviewed - No data to display  Imaging Review Dg Lumbar Spine Complete  Result Date: 02/09/2017 CLINICAL DATA:  Low back pain following heavy lifting, initial encounter EXAM: LUMBAR SPINE - COMPLETE 4+ VIEW COMPARISON:  None. FINDINGS: Five lumbar type vertebral bodies are well visualized. Vertebral body height is well maintained. No soft tissue abnormality is seen. No pars defects are noted. No anterolisthesis is seen. Mild disc space narrowing is noted at L4-5. IMPRESSION: Mild degenerative change without acute abnormality. Electronically Signed   By: Inez Catalina M.D.   On: 02/09/2017 11:11     Visual Acuity Review  Right Eye Distance:   Left Eye Distance:   Bilateral Distance:    Right Eye Near:   Left Eye Near:    Bilateral Near:     Medications  ketorolac (TORADOL) injection 60 mg (60 mg Intramuscular Given 02/09/17 1014)      MDM   1. Strain of lumbar region, initial encounter    New Prescriptions   DIAZEPAM (VALIUM) 2 MG TABLET    Take 1 tablet (2 mg total) by mouth 3 (three) times daily.   METAXALONE (SKELAXIN) 800 MG TABLET    Take 1 tablet (800 mg total) by mouth 3 (three) times daily. Start taking after finishing the Valium for muscle spasm   NAPROXEN (NAPROSYN) 500 MG TABLET    Take 1 tablet (500 mg total) by mouth 2 (two) times daily with a meal.  Plan: 1. Test/x-ray results and diagnosis reviewed with patient 2. rx as per orders; risks, benefits, potential side effects reviewed with  patient 3. Recommend supportive treatment with Rest and symptom avoidance. Limit sitting lifting and bending. She is off Saturday and Sunday and will have her return to work on Tuesday. She needs follow-up with Rosana Hoes FNP at [facility] occupational health within a week for  restriction assignment and return to work status. I told the patient if she develops increasing pain or incontinence or any kind of loss of function of her lower extremities she should go to the emergency room immediately. I've given her Valium for muscle relaxation for 2 days followed by Skelaxin afterwards. She was cautioned not to take these medications when performing activities requiring concentration and judgment. She should not drive while taking them. 4. F/u prn if symptoms worsen or don't improve     Lorin Picket, PA-C 02/09/17 1156

## 2017-02-09 NOTE — ED Notes (Signed)
Patient shows no signs of adverse reaction to medication at this time.  

## 2017-02-09 NOTE — ED Notes (Signed)
Urine drug screen collected by Hilda Blades, [place] Nurse

## 2017-02-15 DIAGNOSIS — M545 Low back pain, unspecified: Secondary | ICD-10-CM | POA: Insufficient documentation

## 2017-03-01 DIAGNOSIS — M47816 Spondylosis without myelopathy or radiculopathy, lumbar region: Secondary | ICD-10-CM | POA: Insufficient documentation

## 2017-08-29 DIAGNOSIS — J309 Allergic rhinitis, unspecified: Secondary | ICD-10-CM | POA: Insufficient documentation

## 2017-09-27 DIAGNOSIS — M7581 Other shoulder lesions, right shoulder: Secondary | ICD-10-CM | POA: Insufficient documentation

## 2018-02-19 ENCOUNTER — Encounter (INDEPENDENT_AMBULATORY_CARE_PROVIDER_SITE_OTHER): Payer: Self-pay | Admitting: Vascular Surgery

## 2018-03-06 ENCOUNTER — Encounter (INDEPENDENT_AMBULATORY_CARE_PROVIDER_SITE_OTHER): Payer: Self-pay | Admitting: Vascular Surgery

## 2018-04-02 ENCOUNTER — Encounter (INDEPENDENT_AMBULATORY_CARE_PROVIDER_SITE_OTHER): Payer: Self-pay | Admitting: Vascular Surgery

## 2018-04-12 ENCOUNTER — Other Ambulatory Visit: Payer: Self-pay | Admitting: Pediatrics

## 2018-04-12 ENCOUNTER — Ambulatory Visit: Payer: BLUE CROSS/BLUE SHIELD

## 2018-04-12 DIAGNOSIS — R1032 Left lower quadrant pain: Secondary | ICD-10-CM

## 2018-10-09 ENCOUNTER — Other Ambulatory Visit: Payer: Self-pay

## 2018-10-09 ENCOUNTER — Other Ambulatory Visit: Payer: Self-pay | Admitting: Family Medicine

## 2018-10-09 ENCOUNTER — Ambulatory Visit
Admission: RE | Admit: 2018-10-09 | Discharge: 2018-10-09 | Disposition: A | Discharge status: Home / Self Care | Payer: BLUE CROSS/BLUE SHIELD | Source: Ambulatory Visit | Attending: Family Medicine | Admitting: Family Medicine

## 2018-10-09 ENCOUNTER — Ambulatory Visit
Admission: RE | Admit: 2018-10-09 | Discharge: 2018-10-09 | Disposition: A | Discharge status: Home / Self Care | Payer: BLUE CROSS/BLUE SHIELD | Attending: Family Medicine | Admitting: Family Medicine

## 2018-10-09 DIAGNOSIS — G8929 Other chronic pain: Secondary | ICD-10-CM | POA: Diagnosis present

## 2018-10-09 IMAGING — CR DG KNEE STANDING AP BILAT
3 series · 3 of 3 positions shown · non-contrast
Comparison: None.

CLINICAL DATA: Chronic pain both knees

EXAM:
BILATERAL KNEES STANDING - 1 VIEW

[knee ap both knees standing]
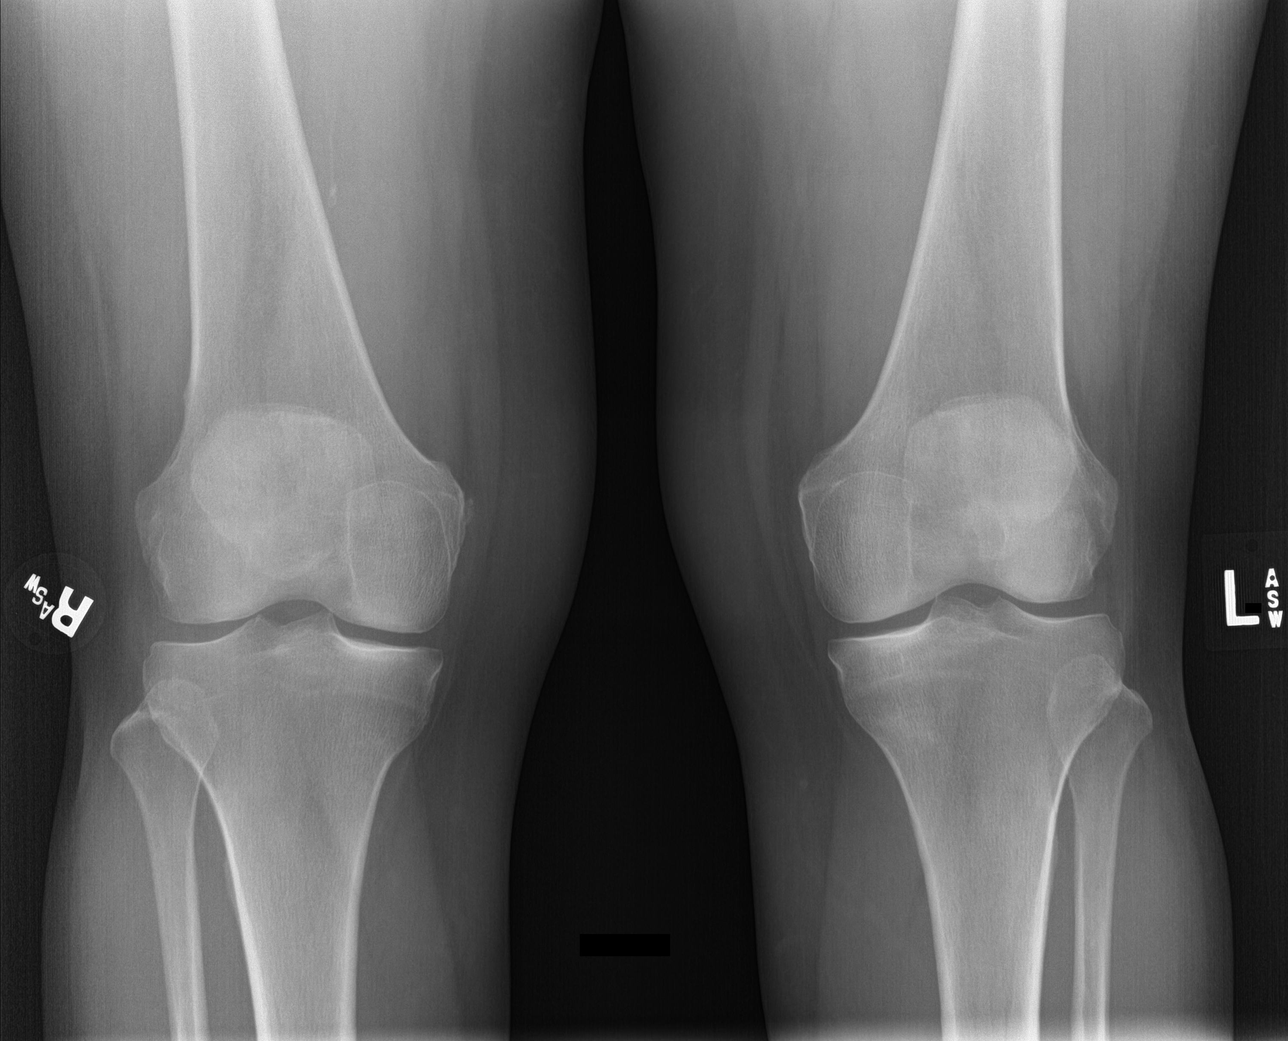

[knee lat (1 of 2)]
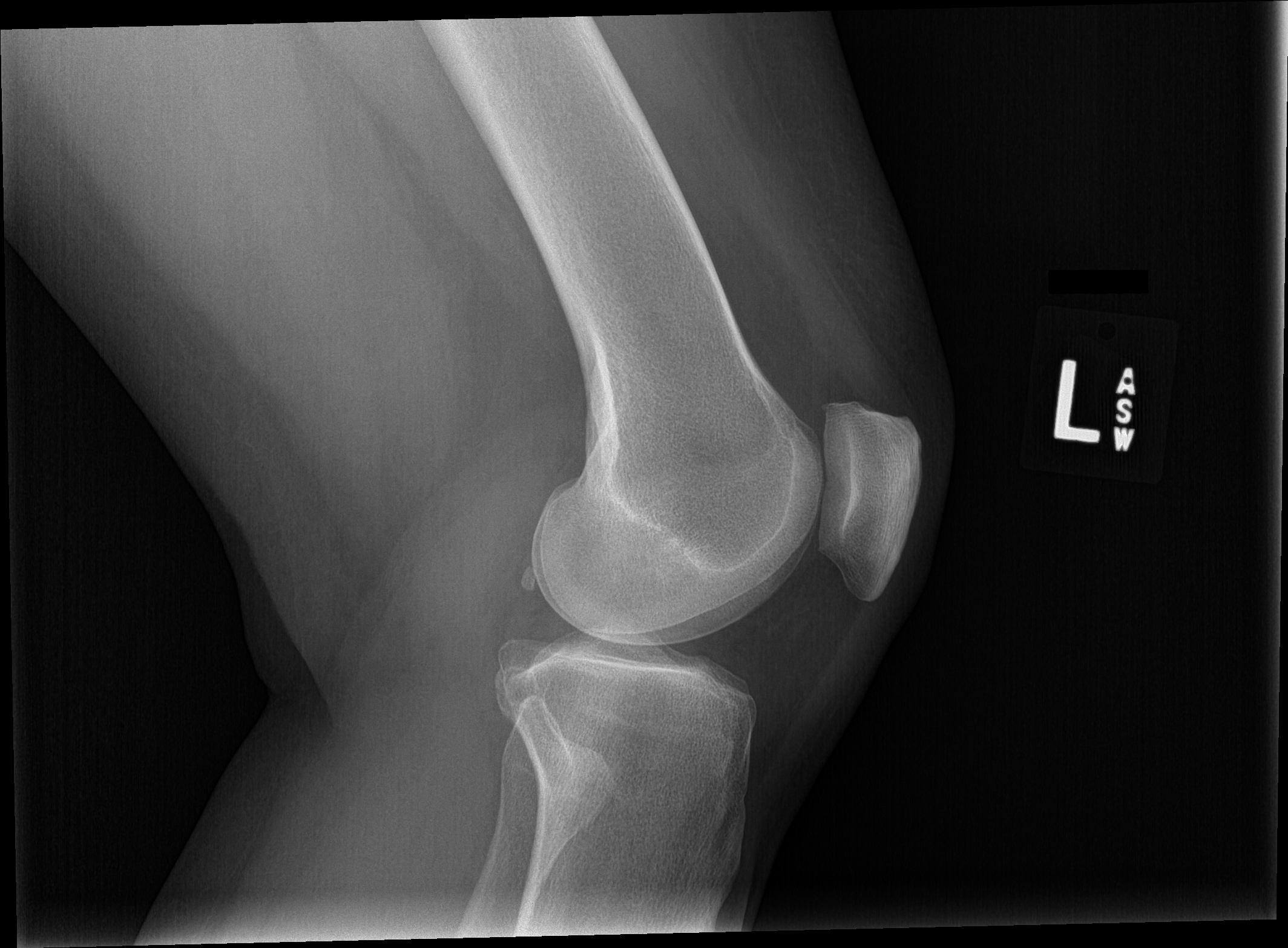

[knee lat (2 of 2)]
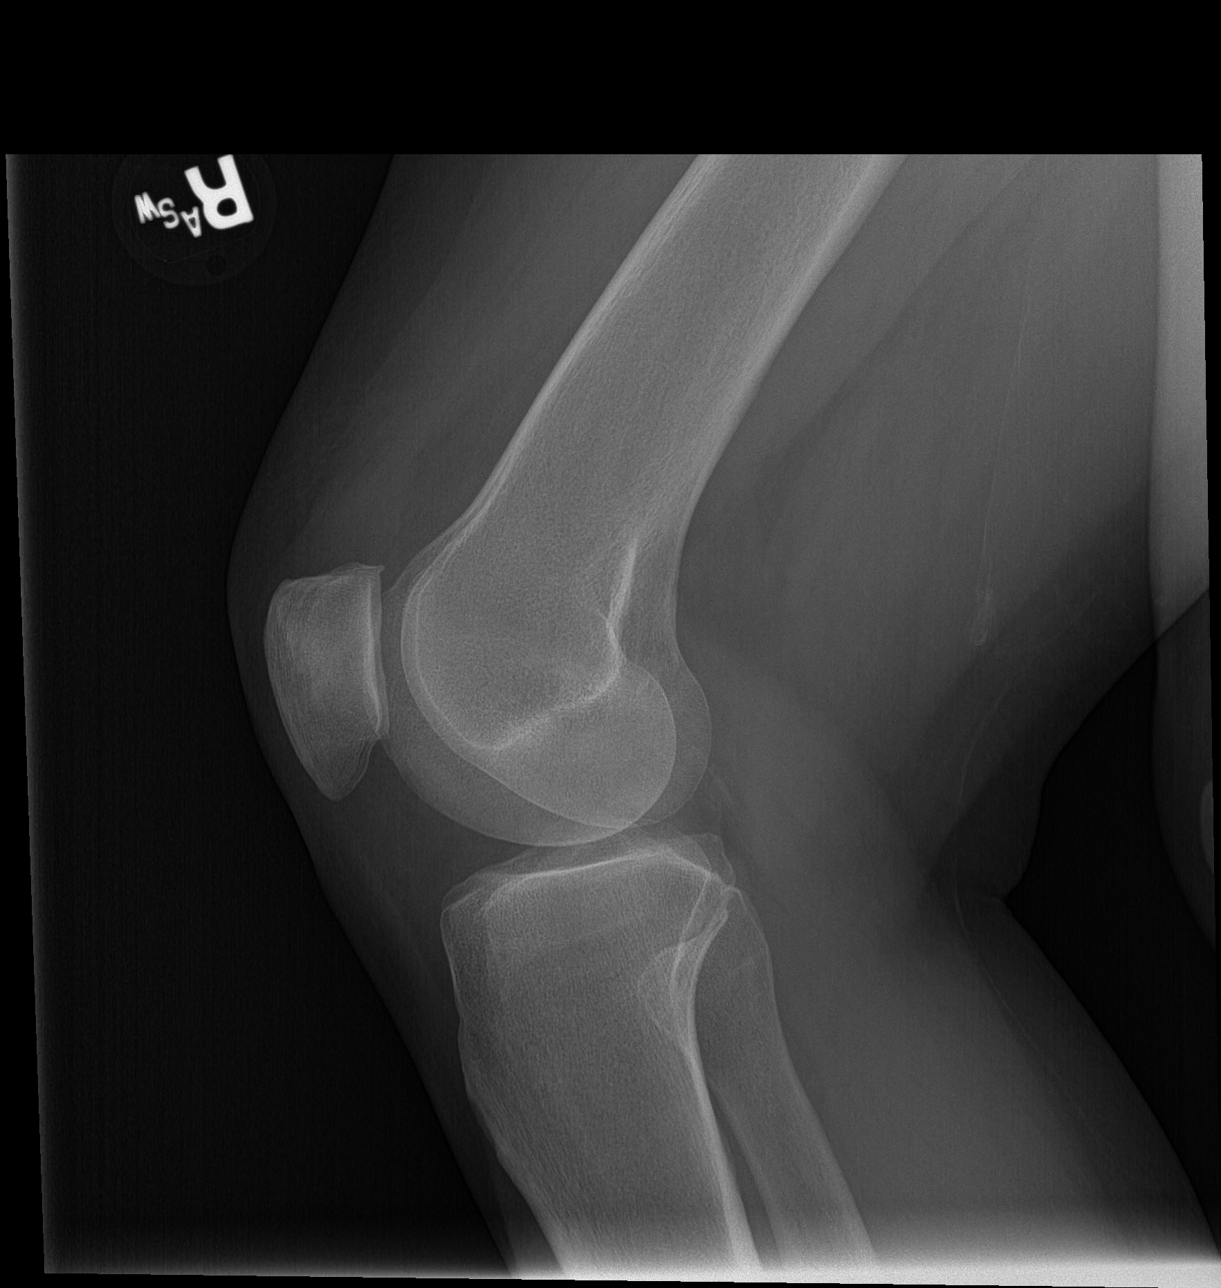

[3 of 3 positions shown; findings below may reference images not displayed]

FINDINGS: Early spurring in the patellofemoral compartments bilaterally. Joint
spaces maintained. No acute bony abnormality. Specifically, no
fracture, subluxation, or dislocation. No joint effusion
IMPRESSION: Early spurring in the patellofemoral compartments. No acute bony
abnormality.

## 2018-11-20 ENCOUNTER — Ambulatory Visit
Admission: RE | Admit: 2018-11-20 | Discharge: 2018-11-20 | Disposition: A | Discharge status: Home / Self Care | Payer: BLUE CROSS/BLUE SHIELD | Source: Ambulatory Visit | Attending: Family Medicine | Admitting: Family Medicine

## 2018-11-20 ENCOUNTER — Other Ambulatory Visit: Payer: Self-pay | Admitting: Family Medicine

## 2018-11-20 ENCOUNTER — Ambulatory Visit
Admission: RE | Admit: 2018-11-20 | Discharge: 2018-11-20 | Disposition: A | Discharge status: Home / Self Care | Payer: BLUE CROSS/BLUE SHIELD | Attending: Family Medicine | Admitting: Family Medicine

## 2018-11-20 ENCOUNTER — Other Ambulatory Visit: Payer: Self-pay

## 2018-11-20 DIAGNOSIS — R0789 Other chest pain: Secondary | ICD-10-CM | POA: Diagnosis present

## 2018-11-20 IMAGING — CR CHEST - 2 VIEW
2 series · 2 of 2 positions shown · non-contrast
Comparison: None.

CLINICAL DATA: Dry cough for 1 month with chest tightness.

EXAM:
CHEST - 2 VIEW

[chest pa]
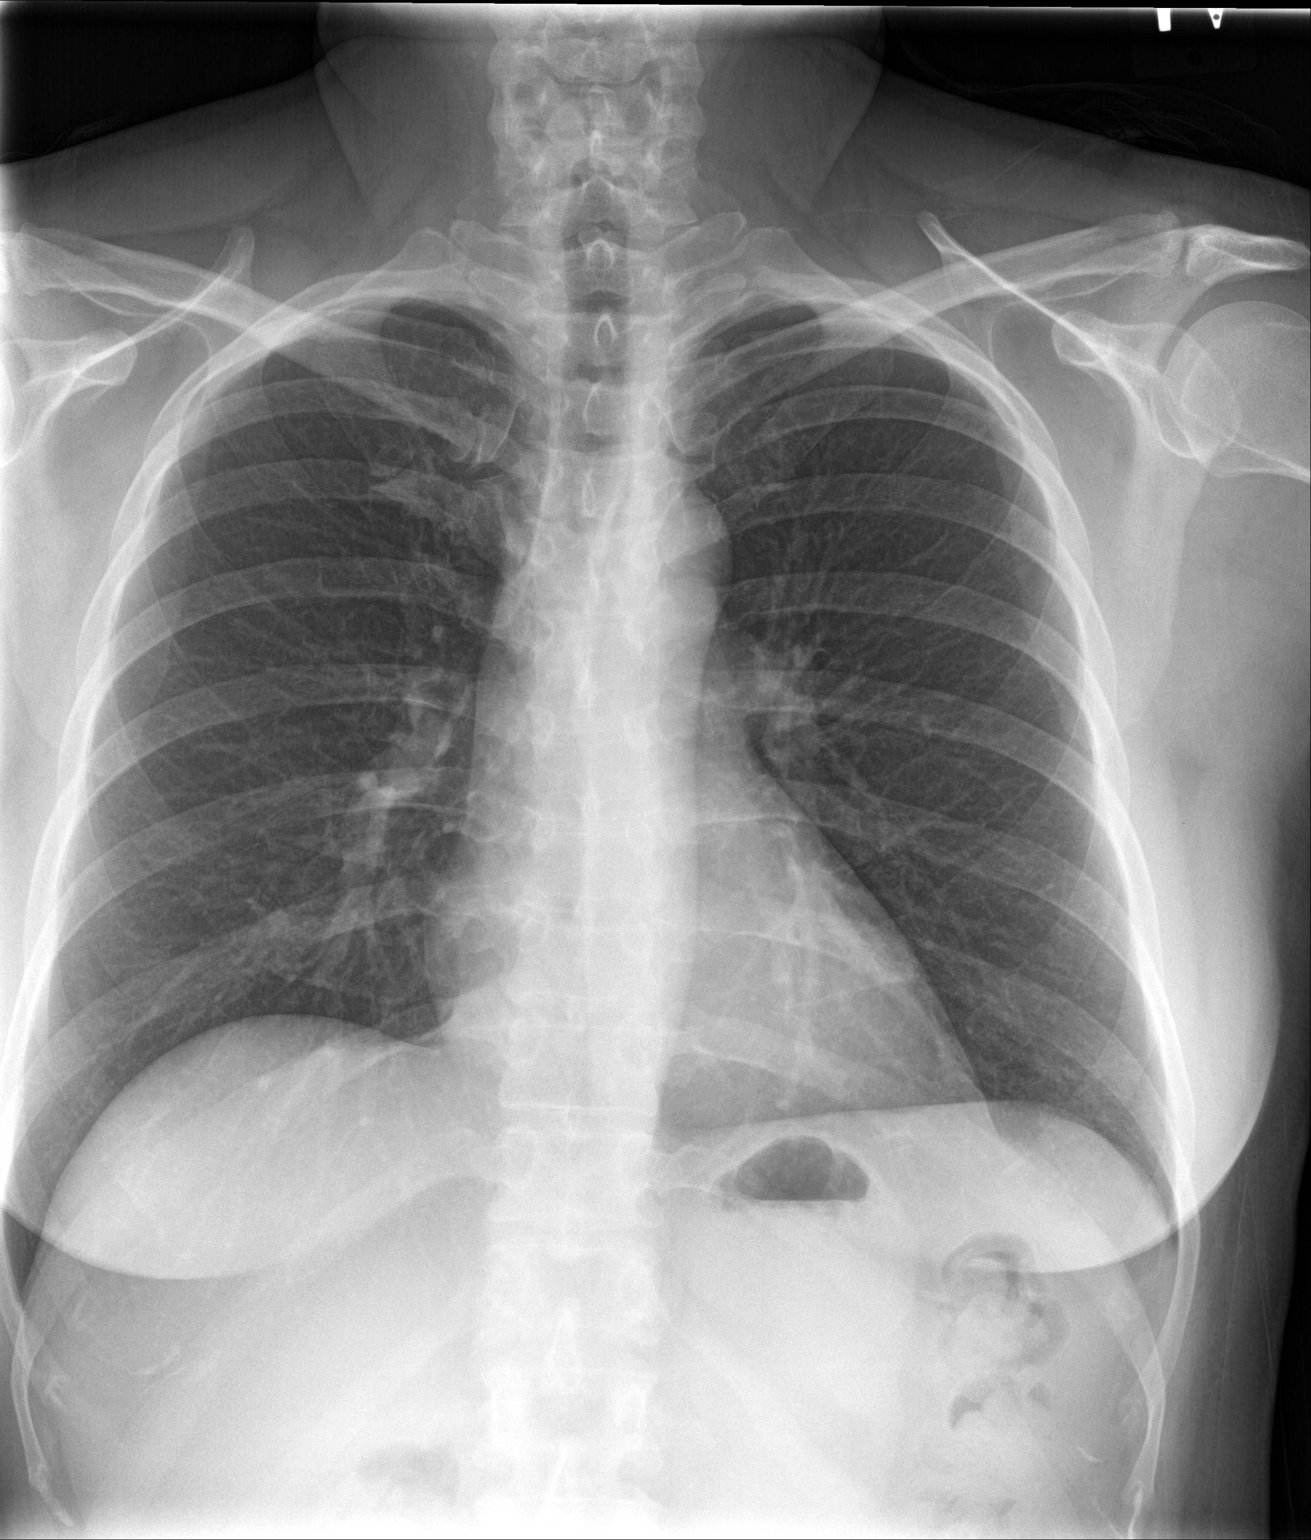

[chest lat]
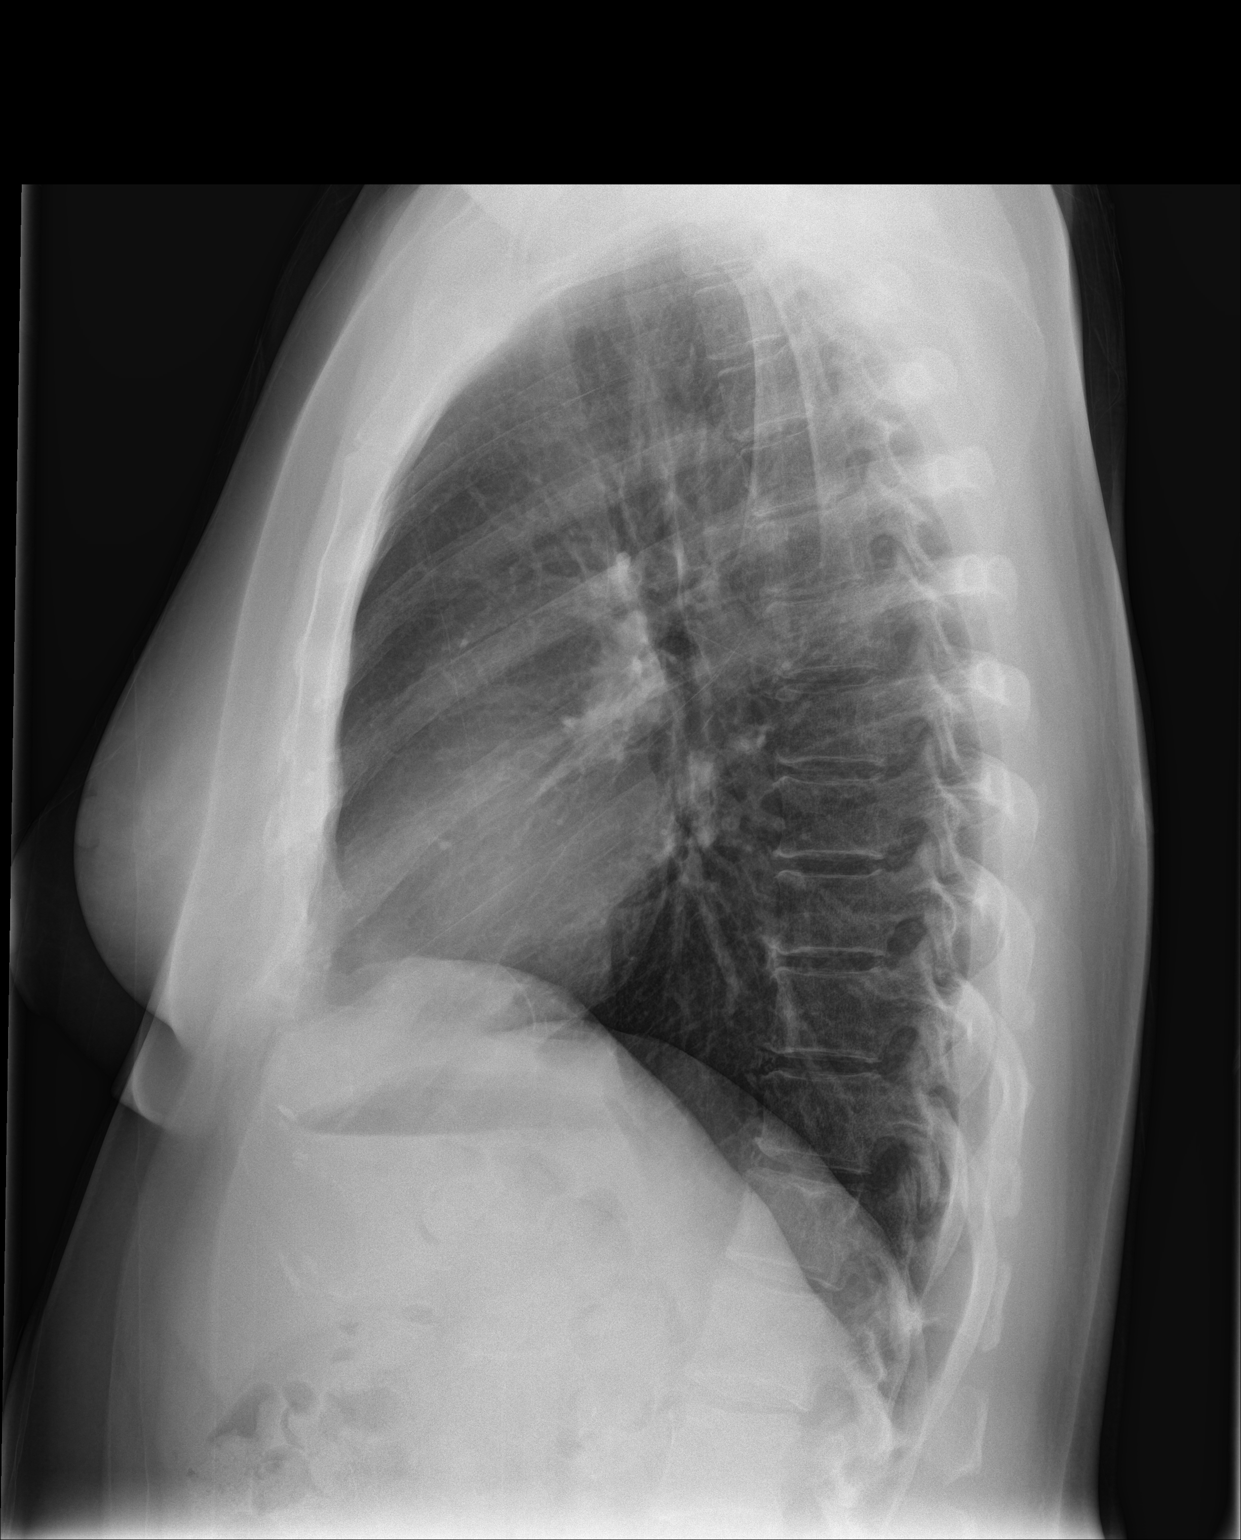

[2 of 2 positions shown; findings below may reference images not displayed]

FINDINGS: The heart size and mediastinal contours are normal. The lungs are
clear. There is no pleural effusion or pneumothorax. No acute
osseous findings are identified.
IMPRESSION: No active cardiopulmonary process.

## 2018-11-28 ENCOUNTER — Other Ambulatory Visit: Payer: Self-pay | Admitting: Family Medicine

## 2018-11-28 DIAGNOSIS — R1011 Right upper quadrant pain: Secondary | ICD-10-CM

## 2018-11-28 DIAGNOSIS — R0789 Other chest pain: Secondary | ICD-10-CM

## 2019-01-28 DIAGNOSIS — J4520 Mild intermittent asthma, uncomplicated: Secondary | ICD-10-CM | POA: Insufficient documentation

## 2019-02-14 ENCOUNTER — Other Ambulatory Visit: Payer: Self-pay | Admitting: Podiatry

## 2019-02-17 ENCOUNTER — Inpatient Hospital Stay: Admission: RE | Admit: 2019-02-17 | Payer: BLUE CROSS/BLUE SHIELD | Source: Ambulatory Visit

## 2019-02-18 ENCOUNTER — Other Ambulatory Visit: Payer: BC Managed Care – PPO

## 2019-02-18 ENCOUNTER — Other Ambulatory Visit: Payer: Self-pay

## 2019-02-18 ENCOUNTER — Encounter
Admission: RE | Admit: 2019-02-18 | Discharge: 2019-02-18 | Disposition: A | Discharge status: Home / Self Care | Payer: BC Managed Care – PPO | Source: Ambulatory Visit | Attending: Podiatry | Admitting: Podiatry

## 2019-02-18 HISTORY — DX: Unspecified asthma, uncomplicated: J45.909

## 2019-02-18 NOTE — Patient Instructions (Signed)
Your procedure is scheduled on: Tuesday 02/25/19 Report to [facility]. To find out your arrival time please call [phone] between 1PM - 3PM on Monday 02/24/19.  Remember: Instructions that are not followed completely may result in serious medical risk, up to and including death, or upon the discretion of your surgeon and anesthesiologist your surgery may need to be rescheduled.     _X__ 1. Do not eat food after midnight the night before your procedure.                 No gum chewing or hard candies. You may drink clear liquids up to 2 hours                 before you are scheduled to arrive for your surgery- DO not drink clear                 liquids within 2 hours of the start of your surgery.                 Clear Liquids include:  water, apple juice without pulp, clear carbohydrate                 drink such as Clearfast or Gatorade, Black Coffee or Tea (Do not add                 anything to coffee or tea). Diabetics water only  __X__2.  On the morning of surgery brush your teeth with toothpaste and water, you                 may rinse your mouth with mouthwash if you wish.  Do not swallow any              toothpaste of mouthwash.     _X__ 3.  No Alcohol for 24 hours before or after surgery.   _X__ 4.  Do Not Smoke or use e-cigarettes For 24 Hours Prior to Your Surgery.                 Do not use any chewable tobacco products for at least 6 hours prior to                 surgery.  ____  5.  Bring all medications with you on the day of surgery if instructed.   __X__  6.  Notify your doctor if there is any change in your medical condition      (cold, fever, infections).     Do not wear jewelry, make-up, hairpins, clips or nail polish. Do not wear lotions, powders, or perfumes.  Do not shave 48 hours prior to surgery. Men may shave face and neck. Do not bring valuables to the hospital.    [facility] is not responsible for  any belongings or valuables.  Contacts, dentures/partials or body piercings may not be worn into surgery. Bring a case for your contacts, glasses or hearing aids, a denture cup will be supplied. Leave your suitcase in the car. After surgery it may be brought to your room. For patients admitted to the hospital, discharge time is determined by your treatment team.   Patients discharged the day of surgery will not be allowed to drive home.   Please read over the following fact sheets that you were given:   MRSA Information  __X__ Take these medicines the morning of surgery with A SIP OF WATER:  1. none  2.   3.   4.  5.  6.  ____ Fleet Enema (as directed)   __X__ Use CHG Soap/SAGE wipes as directed  __X__ Use inhalers on the day of surgery  ____ Stop metformin/Janumet/Farxiga 2 days prior to surgery    ____ Take 1/2 of usual insulin dose the night before surgery. No insulin the morning          of surgery.   ____ Stop Blood Thinners Coumadin/Plavix/Xarelto/Pleta/Pradaxa/Eliquis/Effient/Aspirin  on   Or contact your Surgeon, Cardiologist or Medical Doctor regarding  ability to stop your blood thinners  __X__ Stop Anti-inflammatories 7 days before surgery such as Advil, Ibuprofen, Motrin,  BC or Goodies Powder, Naprosyn, Naproxen, Aleve, Aspirin    __X__ Stop all herbal supplements, fish oil or vitamin E until after surgery. Patient has already stopped Bioflex   ____ Bring C-Pap to the hospital.     Telephone interview. Verbal instructions provided. Patient verbalized understanding. Copy of instructions and CHG to be given at [facility] testing visit.

## 2019-02-18 NOTE — Pre-Procedure Instructions (Signed)
   ECG 12-lead6/23/2020 [place] Component Name Value Ref Range  Vent Rate (bpm) 55   PR Interval (msec) 142   QRS Interval (msec) 82   QT Interval (msec) 448   QTc (msec) 428   Other Result Information  This result has an attachment that is not available.  Result Narrative  Sinus bradycardia RSR' in V1 or V2 ST elevation, probably due to early repolarization Otherwise normal ECG  When compared with ECG of 20-Nov-2018 11:03, Sinus bradycardia has replaced Normal sinus rhythm ST elevation, probably due to early repolarization is now present I reviewed and concur with this report. Electronically signed RP:[email or web address], MD, NEIL ([id number]) on 01/28/2019 10:18:13 PM

## 2019-02-19 ENCOUNTER — Ambulatory Visit: Admission: RE | Admit: 2019-02-19 | Payer: BC Managed Care – PPO | Source: Ambulatory Visit

## 2019-02-21 ENCOUNTER — Other Ambulatory Visit
Admission: RE | Admit: 2019-02-21 | Discharge: 2019-02-21 | Disposition: A | Discharge status: Home / Self Care | Payer: BC Managed Care – PPO | Source: Ambulatory Visit | Attending: Podiatry | Admitting: Podiatry

## 2019-02-21 ENCOUNTER — Other Ambulatory Visit: Payer: Self-pay

## 2019-02-21 DIAGNOSIS — Z1159 Encounter for screening for other viral diseases: Secondary | ICD-10-CM | POA: Insufficient documentation

## 2019-02-21 LAB — SARS CORONAVIRUS 2 (TAT 6-24 HRS): SARS Coronavirus 2: NEGATIVE

## 2019-02-24 MED ORDER — CEFAZOLIN SODIUM-DEXTROSE 2-4 GM/100ML-% IV SOLN
2.0000 g | INTRAVENOUS | Status: AC
  Administered 2019-02-25: 2 g via INTRAVENOUS

## 2019-02-25 ENCOUNTER — Encounter
Admission: RE | Disposition: A | Discharge status: Home / Self Care | Payer: Self-pay | Source: Home / Self Care | Attending: Podiatry

## 2019-02-25 ENCOUNTER — Ambulatory Visit
Admission: RE | Admit: 2019-02-25 | Discharge: 2019-02-25 | Disposition: A | Discharge status: Home / Self Care | Payer: BC Managed Care – PPO | Attending: Podiatry | Admitting: Podiatry

## 2019-02-25 ENCOUNTER — Encounter: Payer: Self-pay | Admitting: Anesthesiology

## 2019-02-25 ENCOUNTER — Other Ambulatory Visit: Payer: Self-pay

## 2019-02-25 ENCOUNTER — Ambulatory Visit: Payer: BC Managed Care – PPO | Admitting: Anesthesiology

## 2019-02-25 DIAGNOSIS — J4520 Mild intermittent asthma, uncomplicated: Secondary | ICD-10-CM | POA: Insufficient documentation

## 2019-02-25 DIAGNOSIS — D2121 Benign neoplasm of connective and other soft tissue of right lower limb, including hip: Secondary | ICD-10-CM | POA: Diagnosis not present

## 2019-02-25 DIAGNOSIS — D367 Benign neoplasm of other specified sites: Secondary | ICD-10-CM | POA: Diagnosis present

## 2019-02-25 DIAGNOSIS — Z79899 Other long term (current) drug therapy: Secondary | ICD-10-CM | POA: Diagnosis not present

## 2019-02-25 DIAGNOSIS — M2041 Other hammer toe(s) (acquired), right foot: Secondary | ICD-10-CM | POA: Insufficient documentation

## 2019-02-25 HISTORY — PX: HAMMER TOE SURGERY: SHX385

## 2019-02-25 SURGERY — CORRECTION, HAMMER TOE
Anesthesia: General | Laterality: Right

## 2019-02-25 MED ORDER — POVIDONE-IODINE 7.5 % EX SOLN
Freq: Once | CUTANEOUS | Status: DC
  Filled 2019-02-25: qty 118

## 2019-02-25 MED ORDER — BUPIVACAINE HCL (PF) 0.5 % IJ SOLN
INTRAMUSCULAR | Status: DC | PRN
  Administered 2019-02-25: 8 mL

## 2019-02-25 MED ORDER — ONDANSETRON HCL 4 MG/2ML IJ SOLN
INTRAMUSCULAR | Status: DC | PRN
  Administered 2019-02-25: 4 mg via INTRAVENOUS

## 2019-02-25 MED ORDER — PROPOFOL 10 MG/ML IV BOLUS
INTRAVENOUS | Status: DC | PRN
  Administered 2019-02-25 (×2): 50 mg via INTRAVENOUS

## 2019-02-25 MED ORDER — DEXAMETHASONE SODIUM PHOSPHATE 10 MG/ML IJ SOLN
INTRAMUSCULAR | Status: DC | PRN
  Administered 2019-02-25: 10 mg via INTRAVENOUS

## 2019-02-25 MED ORDER — LIDOCAINE HCL (PF) 1 % IJ SOLN
INTRAMUSCULAR | Status: AC
  Filled 2019-02-25: qty 30

## 2019-02-25 MED ORDER — BUPIVACAINE HCL (PF) 0.5 % IJ SOLN
INTRAMUSCULAR | Status: AC
  Filled 2019-02-25: qty 30

## 2019-02-25 MED ORDER — MIDAZOLAM HCL 2 MG/2ML IJ SOLN
INTRAMUSCULAR | Status: AC
  Filled 2019-02-25: qty 2

## 2019-02-25 MED ORDER — HYDROCODONE-ACETAMINOPHEN 5-325 MG PO TABS: 1.0000 | ORAL_TABLET | ORAL | 0 refills | Status: DC | PRN

## 2019-02-25 MED ORDER — LIDOCAINE HCL (CARDIAC) PF 100 MG/5ML IV SOSY
PREFILLED_SYRINGE | INTRAVENOUS | Status: DC | PRN
  Administered 2019-02-25: 100 mg via INTRAVENOUS

## 2019-02-25 MED ORDER — FENTANYL CITRATE (PF) 100 MCG/2ML IJ SOLN
INTRAMUSCULAR | Status: DC | PRN
  Administered 2019-02-25 (×2): 50 ug via INTRAVENOUS

## 2019-02-25 MED ORDER — FAMOTIDINE 20 MG PO TABS
ORAL_TABLET | ORAL | Status: AC
  Administered 2019-02-25: 20 mg via ORAL
  Filled 2019-02-25: qty 1

## 2019-02-25 MED ORDER — FENTANYL CITRATE (PF) 100 MCG/2ML IJ SOLN: 25.0000 ug | INTRAMUSCULAR | Status: DC | PRN

## 2019-02-25 MED ORDER — LACTATED RINGERS IV SOLN: INTRAVENOUS | Status: DC

## 2019-02-25 MED ORDER — CEFAZOLIN SODIUM-DEXTROSE 2-4 GM/100ML-% IV SOLN
INTRAVENOUS | Status: AC
  Filled 2019-02-25: qty 100

## 2019-02-25 MED ORDER — ACETAMINOPHEN 10 MG/ML IV SOLN
INTRAVENOUS | Status: DC | PRN
  Administered 2019-02-25: 1000 mg via INTRAVENOUS

## 2019-02-25 MED ORDER — ACETAMINOPHEN 10 MG/ML IV SOLN
INTRAVENOUS | Status: AC
  Filled 2019-02-25: qty 100

## 2019-02-25 MED ORDER — LACTATED RINGERS IV SOLN
INTRAVENOUS | Status: DC | PRN
  Administered 2019-02-25: 13:00:00 via INTRAVENOUS

## 2019-02-25 MED ORDER — PROPOFOL 10 MG/ML IV BOLUS
INTRAVENOUS | Status: AC
  Filled 2019-02-25: qty 20

## 2019-02-25 MED ORDER — KETOROLAC TROMETHAMINE 30 MG/ML IJ SOLN
INTRAMUSCULAR | Status: DC | PRN
  Administered 2019-02-25: 30 mg via INTRAVENOUS

## 2019-02-25 MED ORDER — FENTANYL CITRATE (PF) 100 MCG/2ML IJ SOLN
INTRAMUSCULAR | Status: AC
  Filled 2019-02-25: qty 2

## 2019-02-25 MED ORDER — ONDANSETRON HCL 4 MG/2ML IJ SOLN: 4.0000 mg | Freq: Once | INTRAMUSCULAR | Status: DC | PRN

## 2019-02-25 MED ORDER — MIDAZOLAM HCL 2 MG/2ML IJ SOLN
INTRAMUSCULAR | Status: DC | PRN
  Administered 2019-02-25: 2 mg via INTRAVENOUS

## 2019-02-25 MED ORDER — FAMOTIDINE 20 MG PO TABS
20.0000 mg | ORAL_TABLET | Freq: Once | ORAL | Status: AC
  Administered 2019-02-25: 20 mg via ORAL

## 2019-02-25 SURGICAL SUPPLY — 45 items
BANDAGE ELASTIC 4 CLIP NS LF (GAUZE/BANDAGES/DRESSINGS) ×3 IMPLANT
BLADE OSC/SAGITTAL MD 5.5X18 (BLADE) ×3 IMPLANT
BLADE SURG 15 STRL LF DISP TIS (BLADE) ×2 IMPLANT
BLADE SURG 15 STRL SS (BLADE) ×4
BLADE SURG MINI STRL (BLADE) ×3 IMPLANT
BNDG CONFORM 2 STRL LF (GAUZE/BANDAGES/DRESSINGS) ×3 IMPLANT
BNDG ESMARK 4X12 TAN STRL LF (GAUZE/BANDAGES/DRESSINGS) ×1 IMPLANT
CANISTER SUCT 1200ML W/VALVE (MISCELLANEOUS) ×3 IMPLANT
CLOSURE WOUND 1/4X4 (GAUZE/BANDAGES/DRESSINGS) ×1
COVER WAND RF STERILE (DRAPES) ×3 IMPLANT
CUFF TOURN SGL QUICK 12 (TOURNIQUET CUFF) IMPLANT
CUFF TOURN SGL QUICK 18X4 (TOURNIQUET CUFF) IMPLANT
DRAPE FLUOR MINI C-ARM 54X84 (DRAPES) ×3 IMPLANT
DURAPREP 26ML APPLICATOR (WOUND CARE) ×3 IMPLANT
ELECT REM PT RETURN 9FT ADLT (ELECTROSURGICAL) ×3
ELECTRODE REM PT RTRN 9FT ADLT (ELECTROSURGICAL) ×1 IMPLANT
GAUZE SPONGE 4X4 12PLY STRL (GAUZE/BANDAGES/DRESSINGS) ×3 IMPLANT
GAUZE XEROFORM 1X8 LF (GAUZE/BANDAGES/DRESSINGS) ×3 IMPLANT
GLOVE BIO SURGEON STRL SZ7.5 (GLOVE) ×3 IMPLANT
GLOVE INDICATOR 8.0 STRL GRN (GLOVE) ×3 IMPLANT
GOWN STRL REUS W/ TWL LRG LVL3 (GOWN DISPOSABLE) ×2 IMPLANT
GOWN STRL REUS W/TWL LRG LVL3 (GOWN DISPOSABLE) ×4
KIT TURNOVER KIT A (KITS) ×3 IMPLANT
LABEL OR SOLS (LABEL) ×3 IMPLANT
NDL FILTER BLUNT 18X1 1/2 (NEEDLE) ×1 IMPLANT
NDL HYPO 25X1 1.5 SAFETY (NEEDLE) ×2 IMPLANT
NEEDLE FILTER BLUNT 18X 1/2SAF (NEEDLE) ×2
NEEDLE FILTER BLUNT 18X1 1/2 (NEEDLE) ×1 IMPLANT
NEEDLE HYPO 25X1 1.5 SAFETY (NEEDLE) ×6 IMPLANT
NS IRRIG 500ML POUR BTL (IV SOLUTION) ×3 IMPLANT
PACK EXTREMITY ARMC (MISCELLANEOUS) ×3 IMPLANT
PAD CAST CTTN 4X4 STRL (SOFTGOODS) ×2 IMPLANT
PADDING CAST COTTON 4X4 STRL (SOFTGOODS)
RASP SM TEAR CROSS CUT (RASP) ×3 IMPLANT
SOL PREP PVP 2OZ (MISCELLANEOUS) ×3
SOLUTION PREP PVP 2OZ (MISCELLANEOUS) ×1 IMPLANT
SPLINT FAST PLASTER 5X30 (CAST SUPPLIES)
SPLINT PLASTER CAST FAST 5X30 (CAST SUPPLIES) ×15 IMPLANT
STOCKINETTE STRL 6IN 960660 (GAUZE/BANDAGES/DRESSINGS) ×3 IMPLANT
STRIP CLOSURE SKIN 1/4X4 (GAUZE/BANDAGES/DRESSINGS) ×2 IMPLANT
SUT ETHILON 5-0 FS-2 18 BLK (SUTURE) ×3 IMPLANT
SUT VIC AB 4-0 FS2 27 (SUTURE) ×3 IMPLANT
SYR 10ML LL (SYRINGE) ×3 IMPLANT
WIRE Z .035 C-WIRE SPADE TIP (WIRE) ×2 IMPLANT
WIRE Z .062 C-WIRE SPADE TIP (WIRE) ×2 IMPLANT

## 2019-02-25 NOTE — Discharge Instructions (Addendum)
1.  Elevate the right lower extremity.  2.  Keep the bandage on the right foot clean, dry, and do not remove.  3.  Sponge bathe only right lower extremity.  4.  Wear surgical shoe on the right foot whenever walking or standing.  5.  Take 1 pain pill, Norco, every 4 hours only if needed for pain.  AMBULATORY SURGERY  DISCHARGE INSTRUCTIONS   1) The drugs that you were given will stay in your system until tomorrow so for the next 24 hours you should not:  A) Drive an automobile B) Make any legal decisions C) Drink any alcoholic beverage   2) You may resume regular meals tomorrow.  Today it is better to start with liquids and gradually work up to solid foods.  You may eat anything you prefer, but it is better to start with liquids, then soup and crackers, and gradually work up to solid foods.   3) Please notify your doctor immediately if you have any unusual bleeding, trouble breathing, redness and pain at the surgery site, drainage, fever, or pain not relieved by medication.    4) Additional Instructions:        Please contact your physician with any problems or Same Day Surgery at [phone], Monday through Friday 6 am to 4 pm, or [place] at [facility] number at [phone].

## 2019-02-25 NOTE — Op Note (Signed)
Date of operation: 02/25/2019.  Surgeon: Durward Fortes D.P.M.  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  Postoperative diagnosis: Same pending pathology.  Procedure: Arthroplasty right fifth toe with excision soft tissue lesion.  Anesthesia: LMA with local.  Hemostasis: Pneumatic tourniquet right ankle 250 mmHg.  Estimated blood loss: Less than 5 cc.  Pathology: Soft tissue tumor right fifth toe.  Complications: None apparent.  Operative indications: This is a 53 year old female with a recent history of a painful knot on her right fifth toe.  X-rays in the office revealed some calcification in the soft tissues and decision was made for excision of the lesion with biopsy for pathology.  Operative procedure: Patient was taken to the operating room and placed on the table in the supine position.  Following satisfactory LMA anesthesia the right foot was anesthetized with 8 cc of 0.5% Marcaine plain around the fifth metatarsal.  A pneumatic tourniquet was applied to the level of the right ankle and the foot was prepped and draped in the usual sterile fashion.  The foot was exsanguinated and the tourniquet inflated to 250 mmHg.    Attention was then directed to the right fifth toe where an approximate 2 cm slightly curvilinear incision was made over the fifth toe ending just lateral to the nail.  The incision was deepened via sharp and blunt dissection.  At the distal aspect of the incision there was noted to be a soft tissue lesion that was carefully excised from the normal surrounding anatomy and removed in toto.  This was sent for pathology.  A transverse tenotomy was then performed at the head of the proximal phalanx and the head was delivered from the wound.  Using a sagittal saw the head was removed reducing contracture on the fifth toe.  Soft tissues freed off of the medial aspect of the middle and distal phalanx and then the medial one fourth was then  removed.  Intraoperative FluoroScan views revealed good reduction of the bony deformity and the calcification area.  The wound was flushed with copious amounts sterile saline and closed using 4-0 Vicryl simple interrupted suture for tendon reapproximation followed by skin closure using 5-0 nylon simple interrupted suture.  Xeroform 4 x 4's and con form applied to the right foot.  Tourniquet was released and blood flow noted to return immediately to the right foot.  Patient tolerated the procedure and anesthesia well and was transported to the PACU with vital signs stable and in good condition.

## 2019-02-25 NOTE — Interval H&P Note (Signed)
History and Physical Interval Note:  02/25/2019 1:55 PM  Joanna Keith  has presented today for surgery, with the diagnosis of M20.41 Hammertoe.  The various methods of treatment have been discussed with the patient and family. After consideration of risks, benefits and other options for treatment, the patient has consented to  Procedure(s): HAMMER TOE CORRECTION (Right) as a surgical intervention.  The patient's history has been reviewed, patient examined, no change in status, stable for surgery.  I have reviewed the patient's chart and labs.  Questions were answered to the patient's satisfaction.     Durward Fortes

## 2019-02-25 NOTE — Anesthesia Post-op Follow-up Note (Signed)
Anesthesia QCDR form completed.        

## 2019-02-25 NOTE — Anesthesia Preprocedure Evaluation (Signed)
Anesthesia Evaluation  Patient identified by MRN, date of birth, ID band Patient awake    Reviewed: Allergy & Precautions, NPO status , Patient's Chart, lab work & pertinent test results, reviewed documented beta blocker date and time   Airway Mallampati: II  TM Distance: >3 FB     Dental  (+) Chipped   Pulmonary           Cardiovascular      Neuro/Psych    GI/Hepatic   Endo/Other    Renal/GU      Musculoskeletal   Abdominal   Peds  Hematology   Anesthesia Other Findings   Reproductive/Obstetrics                             Anesthesia Physical Anesthesia Plan  ASA: II  Anesthesia Plan: General   Post-op Pain Management:    Induction: Intravenous  PONV Risk Score and Plan:   Airway Management Planned: LMA  Additional Equipment:   Intra-op Plan:   Post-operative Plan:   Informed Consent: I have reviewed the patients History and Physical, chart, labs and discussed the procedure including the risks, benefits and alternatives for the proposed anesthesia with the patient or authorized representative who has indicated his/her understanding and acceptance.       Plan Discussed with: CRNA  Anesthesia Plan Comments:         Anesthesia Quick Evaluation

## 2019-02-25 NOTE — Anesthesia Procedure Notes (Signed)
Procedure Name: LMA Insertion Date/Time: 02/25/2019 1:15 PM Performed by: Justus Memory, CRNA Pre-anesthesia Checklist: Patient identified, Patient being monitored, Timeout performed, Emergency Drugs available and Suction available Patient Re-evaluated:Patient Re-evaluated prior to induction Oxygen Delivery Method: Circle system utilized Preoxygenation: Pre-oxygenation with 100% oxygen Induction Type: IV induction Ventilation: Mask ventilation without difficulty LMA: LMA inserted LMA Size: 3.5 Tube type: Oral Number of attempts: 1 Placement Confirmation: positive ETCO2 and breath sounds checked- equal and bilateral Tube secured with: Tape Dental Injury: Teeth and Oropharynx as per pre-operative assessment

## 2019-02-25 NOTE — H&P (Signed)
  Medical history and physical in the chart was reviewed.  Patient stable for surgery.

## 2019-02-25 NOTE — Transfer of Care (Signed)
Immediate Anesthesia Transfer of Care Note  Patient: Joanna Keith  Procedure(s) Performed: HAMMER TOE CORRECTION (Right )  Patient Location: PACU  Anesthesia Type:General  Level of Consciousness: sedated  Airway & Oxygen Therapy: Patient Spontanous Breathing and Patient connected to face mask oxygen  Post-op Assessment: Report given to RN and Post -op Vital signs reviewed and stable  Post vital signs: Reviewed and stable  Last Vitals:  Vitals Value Taken Time  BP 112/74 02/25/19 1401  Temp 36.3 C 02/25/19 1400  Pulse 79 02/25/19 1411  Resp 15 02/25/19 1411  SpO2 99 % 02/25/19 1411  Vitals shown include unvalidated device data.  Last Pain:  Vitals:   02/25/19 1400  TempSrc:   PainSc: 0-No pain         Complications: No apparent anesthesia complications

## 2019-02-26 NOTE — Anesthesia Postprocedure Evaluation (Signed)
Anesthesia Post Note  Patient: Joanna Keith  Procedure(s) Performed: HAMMER TOE CORRECTION (Right )  Patient location during evaluation: PACU Anesthesia Type: General Level of consciousness: awake and alert Pain management: pain level controlled Vital Signs Assessment: post-procedure vital signs reviewed and stable Respiratory status: spontaneous breathing, nonlabored ventilation, respiratory function stable and patient connected to nasal cannula oxygen Cardiovascular status: blood pressure returned to baseline and stable Postop Assessment: no apparent nausea or vomiting Anesthetic complications: no     Last Vitals:  Vitals:   02/25/19 1513 02/25/19 1554  BP: 116/67 125/67  Pulse: (!) 56 (!) 53  Resp: 16 16  Temp: 37 C   SpO2: 99% 100%    Last Pain:  Vitals:   02/25/19 1554  TempSrc:   PainSc: 0-No pain                 Huxley Shurley S

## 2019-02-28 LAB — SURGICAL PATHOLOGY

## 2019-07-27 ENCOUNTER — Emergency Department
Admission: EM | Admit: 2019-07-27 | Discharge: 2019-07-28 | Disposition: A | Discharge status: Home / Self Care | Payer: BC Managed Care – PPO | Attending: Emergency Medicine | Admitting: Emergency Medicine

## 2019-07-27 ENCOUNTER — Other Ambulatory Visit: Payer: Self-pay

## 2019-07-27 ENCOUNTER — Emergency Department: Payer: BC Managed Care – PPO

## 2019-07-27 DIAGNOSIS — Z79899 Other long term (current) drug therapy: Secondary | ICD-10-CM | POA: Diagnosis not present

## 2019-07-27 DIAGNOSIS — H5712 Ocular pain, left eye: Secondary | ICD-10-CM | POA: Insufficient documentation

## 2019-07-27 DIAGNOSIS — J45909 Unspecified asthma, uncomplicated: Secondary | ICD-10-CM | POA: Diagnosis not present

## 2019-07-27 IMAGING — MR MR HEAD W/O CM
11 series · 42 of 48 positions shown · non-contrast
Comparison: None.

CLINICAL DATA: Left eye pain and photosensitivity

EXAM:
MRI HEAD WITHOUT CONTRAST
TECHNIQUE: Multiplanar, multiecho pulse sequences of the brain and surrounding
structures were obtained without intravenous contrast.

[Series 5: ax dwi_tracew · axial · 3.0mm · 0.60mm/px · z∈[-104,+49]mm · 4 of 48 slices shown]
[im 1/48]
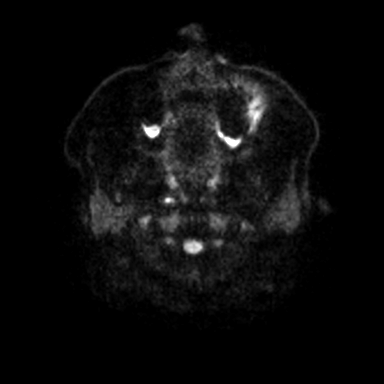
[im 16/48]
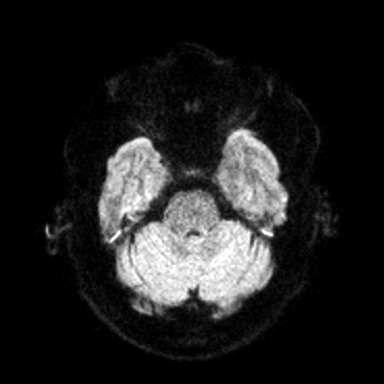
[im 32/48]
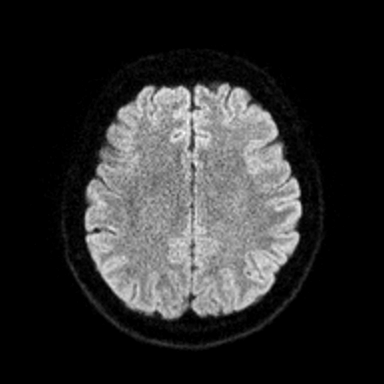
[im 48/48]
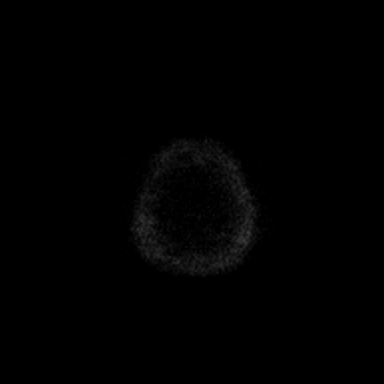

[Series 6: ax dwi_adc · axial · 3.0mm · 0.60mm/px · z∈[-104,+49]mm · 4 of 48 slices shown]
[im 1/48]
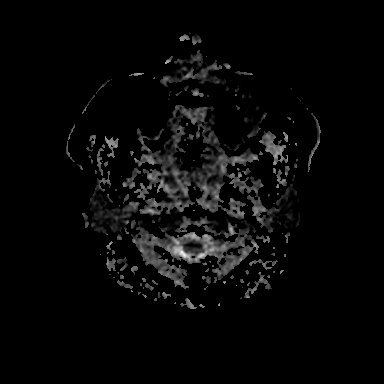
[im 16/48]
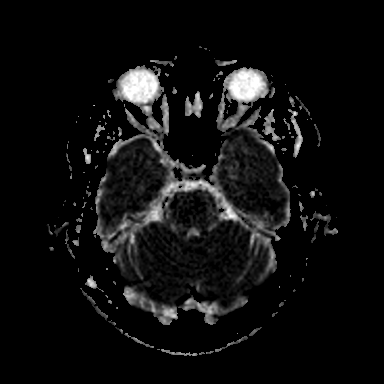
[im 32/48]
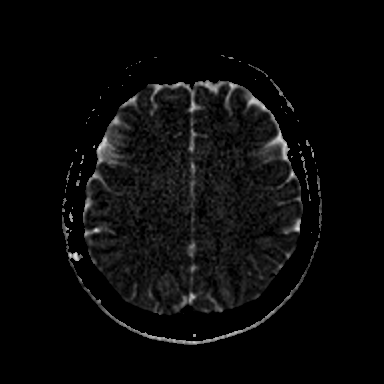
[im 48/48]
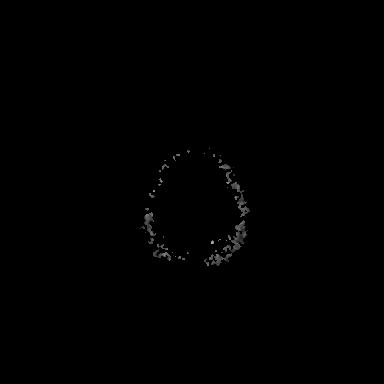

[Series 7: cor dwi_tracew · coronal · 5.0mm · 0.60mm/px · 3 of 36 slices shown]
[im 1/36]
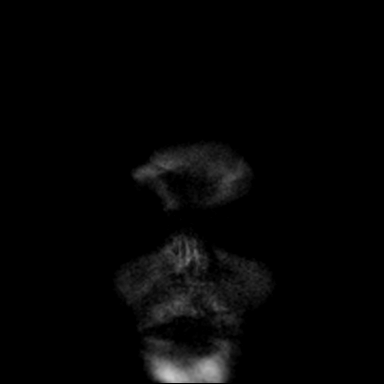
[im 18/36]
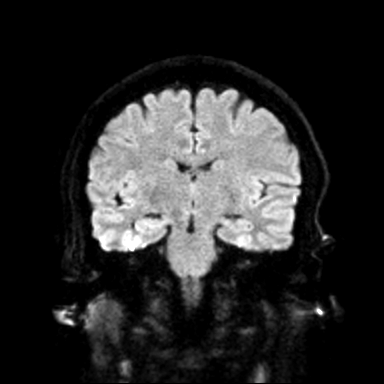
[im 36/36]
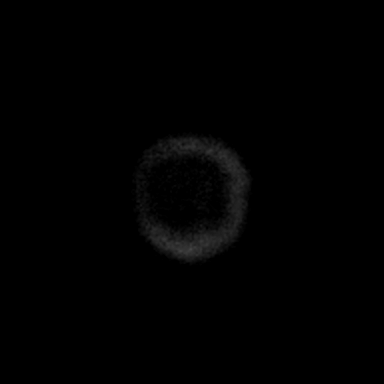

[Series 8: cor dwi_adc · coronal · 5.0mm · 0.60mm/px · 3 of 36 slices shown]
[im 1/36]
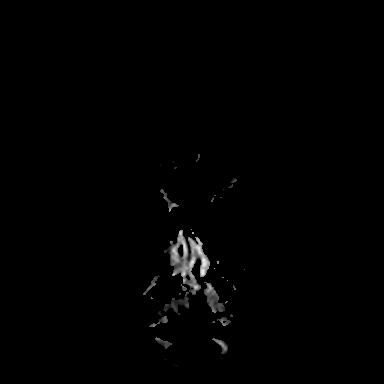
[im 18/36]
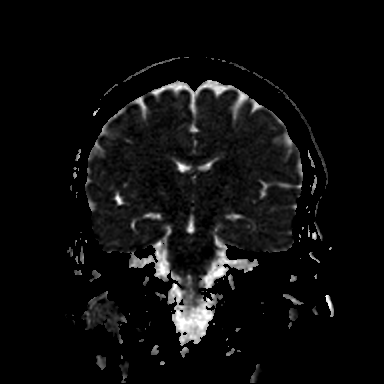
[im 36/36]
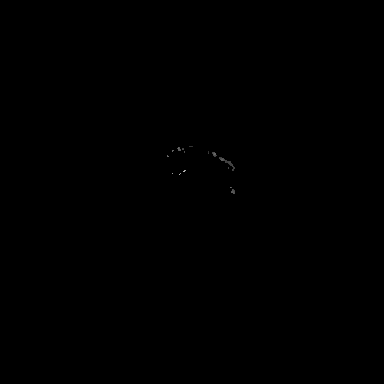

[Series 9: T1 · sagittal · 5.0mm · 0.62mm/px · 2 of 23 slices shown (1 of 2)]
[im 1/23]
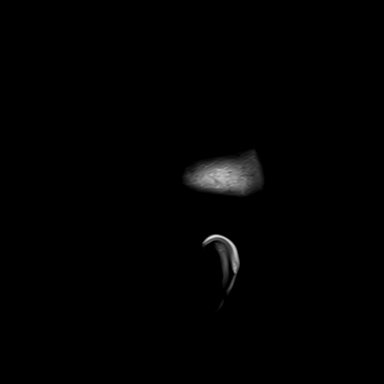
[im 23/23]
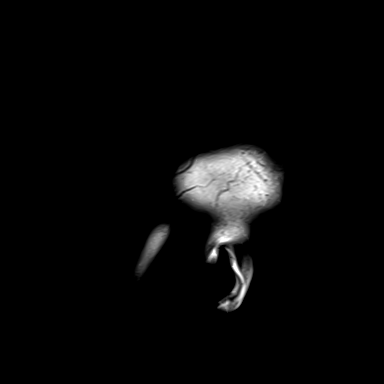

[Series 10: T2 · axial · 5.0mm · 0.53mm/px · z∈[-105,+49]mm · 2 of 27 slices shown (1 of 2)]
[im 1/27]
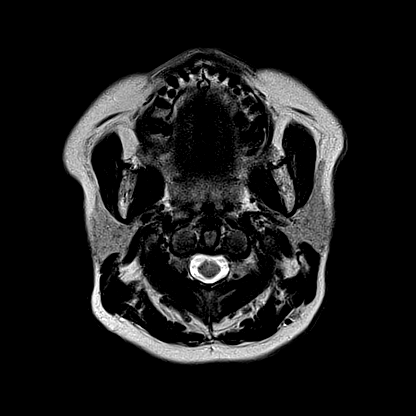
[im 27/27]
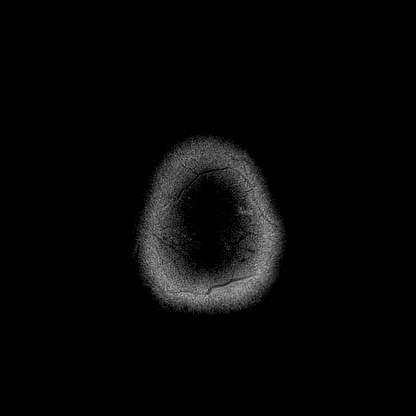

[Series 12: pha_images · axial · 3.0mm · 0.90mm/px · z∈[-116,+59]mm · 5 of 60 slices shown]
[im 1/60]
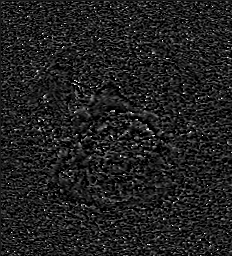
[im 15/60]
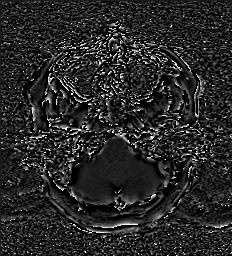
[im 30/60]
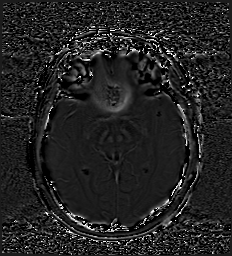
[im 45/60]
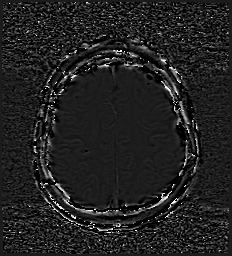
[im 60/60]
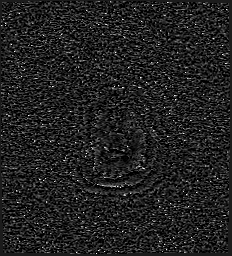

[Series 13: swi_images · axial · 3.0mm · 0.90mm/px · z∈[-116,+15]mm · 4 of 60 slices shown]
[im 1/60]
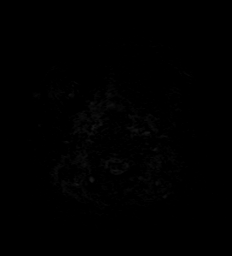
[im 15/60]
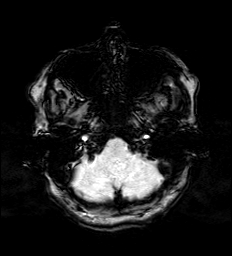
[im 30/60]
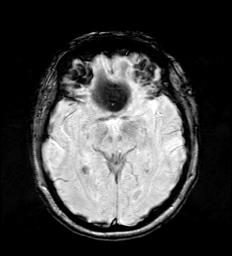
[im 45/60]
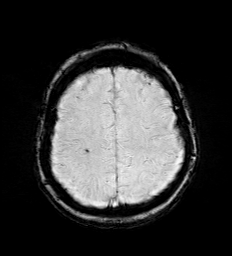

[Series 15: FLAIR · axial · 3.0mm · 0.53mm/px · z∈[-108,+52]mm · 5 of 55 slices shown]
[im 1/55]
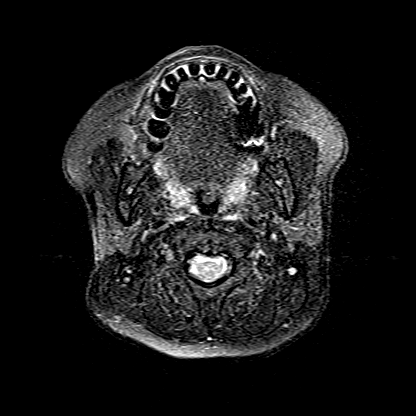
[im 14/55]
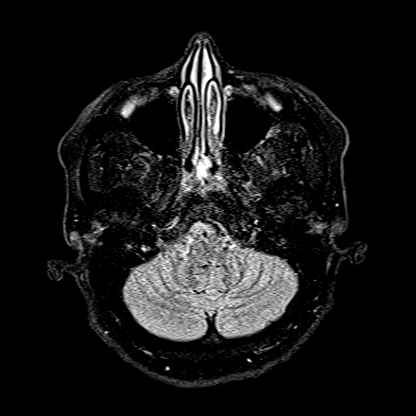
[im 28/55]
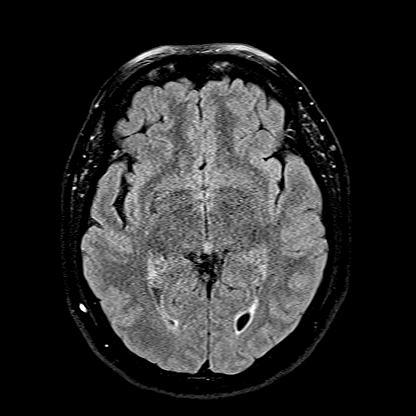
[im 41/55]
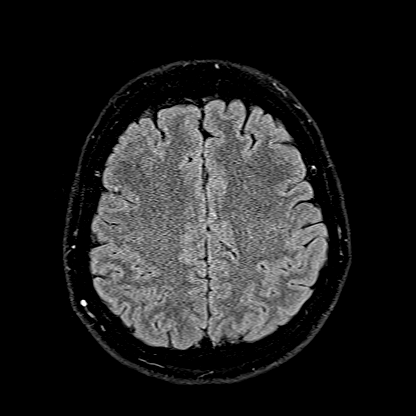
[im 55/55]
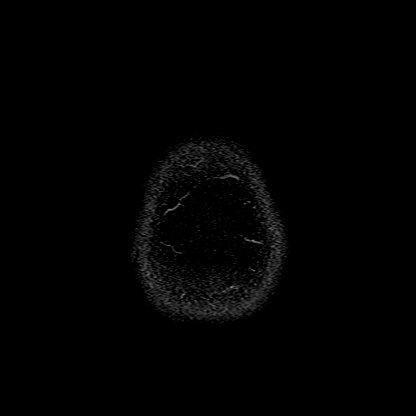

[Series 16: T1 · axial · 1.0mm · 0.98mm/px · z∈[-101,+56]mm · 8 of 160 slices shown (2 of 2)]
[im 1/160]
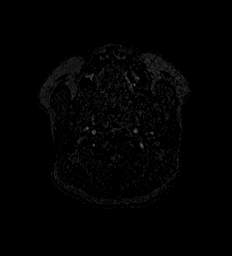
[im 27/160]
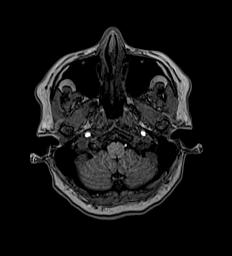
[im 54/160]
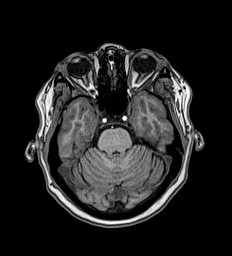
[im 67/160]
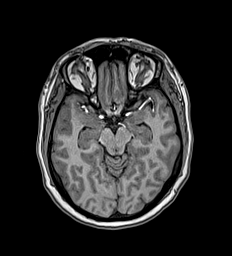
[im 93/160]
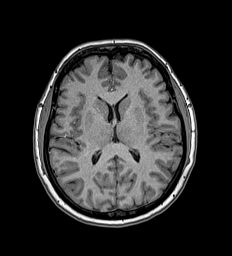
[im 107/160]
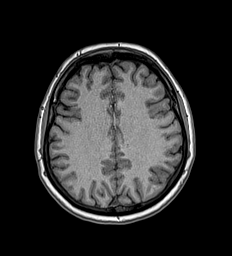
[im 133/160]
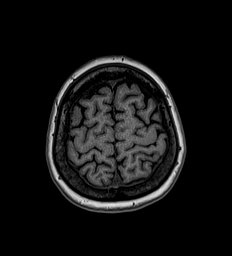
[im 160/160]
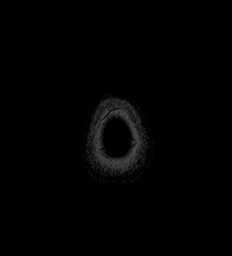

[Series 17: T2 · coronal · 5.0mm · 0.57mm/px · 2 of 27 slices shown (2 of 2)]
[im 1/27]
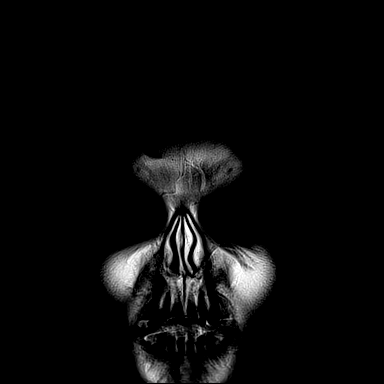
[im 27/27]
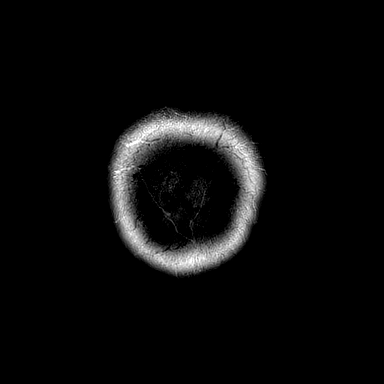

[42 of 48 positions shown; findings below may reference images not displayed]

FINDINGS: BRAIN: There is no acute infarct, acute hemorrhage or extra-axial
collection. The white matter signal is normal for the patient's age.
The cerebral and cerebellar volume are age-appropriate. There is no
hydrocephalus. The midline structures are normal.

VASCULAR: The major intracranial arterial and venous sinus flow
voids are normal. Approximately 5 scattered chronic microhemmorhages
in a peripheral predominant distribution.

SKULL AND UPPER CERVICAL SPINE: Calvarial bone marrow signal is
normal. There is no skull base mass. The visualized upper cervical
spine and soft tissues are normal.

SINUSES/ORBITS: There are no fluid levels or advanced mucosal
thickening. The mastoid air cells and middle ear cavities are free
of fluid. The orbits are normal.
IMPRESSION: 1. No acute intracranial abnormality. Normal appearance of the
orbits.
2. Approximately 5 scattered chronic microhemorrhages in a
nonspecific distribution.

## 2019-07-27 MED ORDER — FLUORESCEIN SODIUM 1 MG OP STRP
1.0000 | ORAL_STRIP | Freq: Once | OPHTHALMIC | Status: AC
  Administered 2019-07-27: 20:00:00 1 via OPHTHALMIC

## 2019-07-27 MED ORDER — FLUORESCEIN SODIUM 1 MG OP STRP
ORAL_STRIP | OPHTHALMIC | Status: AC
  Filled 2019-07-27: qty 1

## 2019-07-27 MED ORDER — TETRACAINE HCL 0.5 % OP SOLN
1.0000 [drp] | Freq: Once | OPHTHALMIC | Status: AC
  Administered 2019-07-27: 1 [drp] via OPHTHALMIC

## 2019-07-27 MED ORDER — LORAZEPAM 2 MG/ML IJ SOLN
1.0000 mg | Freq: Once | INTRAMUSCULAR | Status: AC
  Administered 2019-07-27: 1 mg via INTRAVENOUS
  Filled 2019-07-27: qty 1

## 2019-07-27 MED ORDER — ONDANSETRON HCL 4 MG/2ML IJ SOLN
4.0000 mg | Freq: Once | INTRAMUSCULAR | Status: AC
  Administered 2019-07-27: 4 mg via INTRAVENOUS
  Filled 2019-07-27: qty 2

## 2019-07-27 MED ORDER — OXYCODONE-ACETAMINOPHEN 5-325 MG PO TABS
1.0000 | ORAL_TABLET | Freq: Once | ORAL | Status: AC
  Administered 2019-07-27: 21:00:00 1 via ORAL
  Filled 2019-07-27: qty 1

## 2019-07-27 MED ORDER — PREDNISOLONE ACETATE 1 % OP SUSP
1.0000 [drp] | OPHTHALMIC | Status: DC
  Administered 2019-07-27: 1 [drp] via OPHTHALMIC
  Filled 2019-07-27: qty 5

## 2019-07-27 MED ORDER — MORPHINE SULFATE (PF) 4 MG/ML IV SOLN
4.0000 mg | Freq: Once | INTRAVENOUS | Status: AC
  Administered 2019-07-27: 4 mg via INTRAVENOUS
  Filled 2019-07-27: qty 1

## 2019-07-27 NOTE — Discharge Instructions (Addendum)
Use the eyedrops 1 drop to the left eye every 4 hours.  Get at least 1 more dose tonight.  Take the Percocet 1 or 2 pills 4 times a day as needed for pain.  Give Dr. Wallace Going, the eye doctor, a call in the morning.  Let him know that you were the patient seen in the emergency room with eye pain tonight.  They should be out of work you went to be seen very quickly tomorrow.  Return here if the eye pain worsens or if your vision gets blurry.  The MRI today was negative.  Please let Dr. Wallace Going know about this.

## 2019-07-27 NOTE — ED Triage Notes (Signed)
Patient with sudden onset of left eye pain.  Husband reported she had complained of eye pain it resolved then returned.  Of noted patient recently started Venlafixine on 12/15.  Patient with eye watering, appears photosensitive with left eye.

## 2019-07-27 NOTE — ED Provider Notes (Signed)
[facility] Emergency Department Provider Note   ____________________________________________   First MD Initiated Contact with Patient 07/27/19 1933     (approximate)  I have reviewed the triage vital signs and the nursing notes.   HISTORY  Chief Complaint Eye Pain   HPI Samanvitha Dorko is a 53 y.o. female patient comes in with her husband and they told the nurse that she had some eye pain that came on went away with some Motrin and then she took some venlafaxine and the pain came on again.  The pain is in her left eye and is severe.  In the emergency room when I see her patient is lying on her bed holding her right eye and moaning over and over again she will not talk to me.  I am able to open the eye and putting some tetracaine and then some fluorescein.  Fluorescein does not show any corneal abrasions.  Pain however is severe and patient is moaning and writhing on the bed.  She will not calm down enough to tell me anything.  We will try some Ativan first and then some Dilaudid if need be.  We are starting an IV now.         Past Medical History:  Diagnosis Date  . Asthma     There are no problems to display for this patient.   Past Surgical History:  Procedure Laterality Date  . CESAREAN SECTION    . HAMMER TOE SURGERY Right 02/25/2019   Procedure: HAMMER TOE CORRECTION;  Surgeon: Sharlotte Alamo, DPM;  Location: [facility] ORS;  Service: Podiatry;  Laterality: Right;  . HEMORROIDECTOMY      Prior to Admission medications   Medication Sig Start Date End Date Taking? Authorizing Provider  albuterol (VENTOLIN HFA) 108 (90 Base) MCG/ACT inhaler Inhale 2 puffs into the lungs every 6 (six) hours as needed for wheezing or shortness of breath.    [provider]  Bioflavonoid Products (BIOFLEX PO) Take 2 tablets by mouth daily with lunch.    [provider]  HYDROcodone-acetaminophen (NORCO) 5-325 MG tablet Take 1 tablet by mouth every 4 (four)  hours as needed for moderate pain. 02/25/19   Sharlotte Alamo, DPM  montelukast (SINGULAIR) 10 MG tablet Take 10 mg by mouth at bedtime as needed.    [provider]  Polyvinyl Alcohol-Povidone PF (REFRESH) 1.4-0.6 % SOLN Place 1 drop into both eyes daily as needed (for dry eyes).    [provider]    Allergies Patient has no known allergies.  Family History  Problem Relation Age of Onset  . Healthy Mother   . Healthy Father   . Breast cancer Neg Hx     Social History Social History   Tobacco Use  . Smoking status: Never Smoker  . Smokeless tobacco: Never Used  Substance Use Topics  . Alcohol use: No  . Drug use: No    Review of Systems  Unable to obtain  ____________________________________________   PHYSICAL EXAM:  VITAL SIGNS: ED Triage Vitals  Enc Vitals Group     BP 07/27/19 1925 (!) 133/110     Pulse Rate 07/27/19 1925 95     Resp 07/27/19 1925 (!) 30     Temp 07/27/19 1925 (!) 97.5 F (36.4 C)     Temp Source 07/27/19 1925 Axillary     SpO2 07/27/19 1925 100 %     Weight 07/27/19 1926 160 lb (72.6 kg)     Height 07/27/19  1926 5\' 2"  (1.575 m)     Head Circumference --      Peak Flow --      Pain Score 07/27/19 1926 10     Pain Loc --      Pain Edu? --      Excl. in [place]? --     Constitutional: Patient moaning and writhing around in the bed Eyes: Conjunctivae injected on the left patient is holding her hand over the right eye which she is not supposed to hurt pupils round and reactive eye movement appears to be intact Head: Atraumatic. Nose: No congestion/rhinnorhea. Mouth/Throat: Mucous membranes are moist.  Oropharynx non-erythematous. Neck: No stridor.  Cardiovascular: Normal rate, regular rhythm. Grossly normal heart sounds.  Good peripheral circulation. Respiratory: Normal respiratory effort.  No retractions. Lungs CTAB. Gastrointestinal: Soft and nontender. No distention. No abdominal bruits. No CVA tenderness. Musculoskeletal: No  lower extremity tenderness nor edema.  No joint effusions. Neurologic:  Normal speech and language. No gross focal neurologic deficits are appreciated. No gait instability. Skin:  Skin is warm, dry and intact. No rash noted. Psychiatric: Mood and affect are normal. Speech and behavior are normal.  ____________________________________________   LABS (all labs ordered are listed, but only abnormal results are displayed)  Labs Reviewed - No data to display ____________________________________________  EKG   ____________________________________________  RADIOLOGY  ED MD interpretation MRI read by radiology as 5 chronic microhemorrhages but no other acute problems.  Official radiology report(s): MR BRAIN WO CONTRAST  Result Date: 07/27/2019 CLINICAL DATA:  Left eye pain and photosensitivity EXAM: MRI HEAD WITHOUT CONTRAST TECHNIQUE: Multiplanar, multiecho pulse sequences of the brain and surrounding structures were obtained without intravenous contrast. COMPARISON:  None. FINDINGS: BRAIN: There is no acute infarct, acute hemorrhage or extra-axial collection. The white matter signal is normal for the patient's age. The cerebral and cerebellar volume are age-appropriate. There is no hydrocephalus. The midline structures are normal. VASCULAR: The major intracranial arterial and venous sinus flow voids are normal. Approximately 5 scattered chronic microhemmorhages in a peripheral predominant distribution. SKULL AND UPPER CERVICAL SPINE: Calvarial bone marrow signal is normal. There is no skull base mass. The visualized upper cervical spine and soft tissues are normal. SINUSES/ORBITS: There are no fluid levels or advanced mucosal thickening. The mastoid air cells and middle ear cavities are free of fluid. The orbits are normal. IMPRESSION: 1. No acute intracranial abnormality. Normal appearance of the orbits. 2. Approximately 5 scattered chronic microhemorrhages in a nonspecific distribution.  Electronically Signed   By: Ulyses Jarred M.D.   On: 07/27/2019 23:41    ____________________________________________   PROCEDURES  Procedure(s) performed (including Critical Care):  Procedures   ____________________________________________   INITIAL IMPRESSION / ASSESSMENT AND PLAN / ED COURSE ----------------------------------------- 9:58 PM on 07/27/2019 -----------------------------------------  I going to talk to the patient see how she is doing initially she is doing very well that she opens her eyes the pain comes back and she says she is seeing double.  This is a new finding.  It does not go away when she blinks or moves her eyes.  I am going to get an MRI of her head and orbits. The double vision goes away if she closes 1 eye but remains if she keeps both eyes open.  Rest of her cranial nerves appear to be intact although I did not check visual fields motor strength is 5/5 throughout rapid alternating movements and hands is normal patient does not report any numbness  MRI is negative.  Patient reports pain is gone now double vision is also gone.,  Unsure what this was it was very brief.  We will have her follow-up with eye doctor.          ____________________________________________   FINAL CLINICAL IMPRESSION(S) / ED DIAGNOSES  Final diagnoses:  Left eye pain     ED Discharge Orders    None       Note:  This document was prepared using Dragon voice recognition software and may include unintentional dictation errors.    Nena Polio, MD 07/28/19 [phone]

## 2019-07-27 NOTE — ED Notes (Signed)
Patient transported to MRI 

## 2019-08-04 ENCOUNTER — Other Ambulatory Visit: Payer: Self-pay

## 2019-08-04 ENCOUNTER — Emergency Department
Admission: EM | Admit: 2019-08-04 | Discharge: 2019-08-05 | Disposition: A | Discharge status: Home / Self Care | Payer: BC Managed Care – PPO | Attending: Emergency Medicine | Admitting: Emergency Medicine

## 2019-08-04 DIAGNOSIS — Z79899 Other long term (current) drug therapy: Secondary | ICD-10-CM | POA: Diagnosis not present

## 2019-08-04 DIAGNOSIS — H5712 Ocular pain, left eye: Secondary | ICD-10-CM | POA: Diagnosis present

## 2019-08-04 DIAGNOSIS — J45909 Unspecified asthma, uncomplicated: Secondary | ICD-10-CM | POA: Diagnosis not present

## 2019-08-04 DIAGNOSIS — H2000 Unspecified acute and subacute iridocyclitis: Secondary | ICD-10-CM | POA: Insufficient documentation

## 2019-08-04 DIAGNOSIS — H209 Unspecified iridocyclitis: Secondary | ICD-10-CM

## 2019-08-04 MED ORDER — CYCLOPENTOLATE HCL 1 % OP SOLN
2.0000 [drp] | Freq: Once | OPHTHALMIC | Status: AC
  Administered 2019-08-04: 2 [drp] via OPHTHALMIC
  Filled 2019-08-04: qty 2

## 2019-08-04 MED ORDER — DEXAMETHASONE SODIUM PHOSPHATE 10 MG/ML IJ SOLN
10.0000 mg | Freq: Once | INTRAMUSCULAR | Status: AC
  Administered 2019-08-04: 10 mg via INTRAVENOUS
  Filled 2019-08-04: qty 1

## 2019-08-04 MED ORDER — MORPHINE SULFATE (PF) 4 MG/ML IV SOLN
4.0000 mg | Freq: Once | INTRAVENOUS | Status: AC
  Administered 2019-08-04: 4 mg via INTRAVENOUS
  Filled 2019-08-04: qty 1

## 2019-08-04 NOTE — ED Provider Notes (Signed)
[facility] Emergency Department Provider Note _____________________   First MD Initiated Contact with Patient 08/04/19 2230     (approximate)  I have reviewed the triage vital signs and the nursing notes.   HISTORY  Chief Complaint Eye Pain    HPI Joanna Keith is a 53 y.o. female with history of asthma and recently diagnosed iritis by Dr. George Ina presents to the emergency department secondary to progressive worsening left eye pain today.  Patient was prescribed prednisolone and atropine after seeing Dr. George Ina and states that seems to been helping however worsened today.  Patient denies any recent illness.       Past Medical History:  Diagnosis Date  . Asthma     There are no problems to display for this patient.   Past Surgical History:  Procedure Laterality Date  . CESAREAN SECTION    . HAMMER TOE SURGERY Right 02/25/2019   Procedure: HAMMER TOE CORRECTION;  Surgeon: Sharlotte Alamo, DPM;  Location: [facility] ORS;  Service: Podiatry;  Laterality: Right;  . HEMORROIDECTOMY      Prior to Admission medications   Medication Sig Start Date End Date Taking? Authorizing Provider  albuterol (VENTOLIN HFA) 108 (90 Base) MCG/ACT inhaler Inhale 2 puffs into the lungs every 6 (six) hours as needed for wheezing or shortness of breath.    [provider]  Bioflavonoid Products (BIOFLEX PO) Take 2 tablets by mouth daily with lunch.    [provider]  HYDROcodone-acetaminophen (NORCO) 5-325 MG tablet Take 1 tablet by mouth every 4 (four) hours as needed for moderate pain. 02/25/19   Sharlotte Alamo, DPM  montelukast (SINGULAIR) 10 MG tablet Take 10 mg by mouth at bedtime as needed.    [provider]  Polyvinyl Alcohol-Povidone PF (REFRESH) 1.4-0.6 % SOLN Place 1 drop into both eyes daily as needed (for dry eyes).    [provider]    Allergies Patient has no known allergies.  Family History  Problem Relation Age of Onset  .  Healthy Mother   . Healthy Father   . Breast cancer Neg Hx     Social History Social History   Tobacco Use  . Smoking status: Never Smoker  . Smokeless tobacco: Never Used  Substance Use Topics  . Alcohol use: No  . Drug use: No    Review of Systems Constitutional: No fever/chills Eyes: Positive for left eye pain and decreased vision. ENT: No sore throat. Cardiovascular: Denies chest pain. Respiratory: Denies shortness of breath. Gastrointestinal: No abdominal pain.  No nausea, no vomiting.  No diarrhea.  No constipation. Genitourinary: Negative for dysuria. Musculoskeletal: Negative for neck pain.  Negative for back pain. Integumentary: Negative for rash. Neurological: Negative for headaches, focal weakness or numbness.  ____________________________________________   PHYSICAL EXAM:  VITAL SIGNS: ED Triage Vitals  Enc Vitals Group     BP 08/04/19 2205 135/82     Pulse Rate 08/04/19 2205 84     Resp 08/04/19 2205 16     Temp 08/04/19 2205 98.8 F (37.1 C)     Temp Source 08/04/19 2205 Oral     SpO2 08/04/19 2205 99 %     Weight 08/04/19 2206 72.6 kg (160 lb)     Height 08/04/19 2206 1.575 m (5\' 2" )     Head Circumference --      Peak Flow --      Pain Score 08/04/19 2206 10     Pain Loc --  Pain Edu? --      Excl. in [place]? --     Constitutional: Alert and oriented.  Eyes: Conjunctivae are normal. Erythema at the Limbus of left eye Head: Atraumatic. {**Ears:  Healthy appearing ear canals and TMs bilaterally Nose: No congestion/rhinnorhea. Mouth/Throat: Patient is wearing a mask. Neck: No stridor.  No meningeal signs.   Cardiovascular: Normal rate, regular rhythm. Good peripheral circulation. Grossly normal heart sounds. Respiratory: Normal respiratory effort.  No retractions. Gastrointestinal: Soft and nontender. No distention.  Musculoskeletal: No lower extremity tenderness nor edema. No gross deformities of extremities. Neurologic:  Normal speech and  language. No gross focal neurologic deficits are appreciated.  Skin:  Skin is warm, dry and intact. Psychiatric: Mood and affect are normal. Speech and behavior are normal.    Procedures   ____________________________________________   INITIAL IMPRESSION / MDM / ASSESSMENT AND PLAN / ED COURSE  As part of my medical decision making, I reviewed the following data within the electronic MEDICAL RECORD NUMBER   53 year old female present with above-stated history and physical exam concerning for iritis.  Patient given IV morphine 4 mg, Decadron 10 mg and cyclopentolate 2 drops in the left eye followed by complete resolution of pain at present.  ____________________________________________  FINAL CLINICAL IMPRESSION(S) / ED DIAGNOSES  Final diagnoses:  Iritis     MEDICATIONS GIVEN DURING THIS VISIT:  Medications  morphine 4 MG/ML injection 4 mg (has no administration in time range)  dexamethasone (DECADRON) injection 10 mg (has no administration in time range)  cyclopentolate (CYCLODRYL,CYCLOGYL) 1 % ophthalmic solution 2 drop (has no administration in time range)     ED Discharge Orders    None      *Please note:  Joanna Keith was evaluated in Emergency Department on 08/04/2019 for the symptoms described in the history of present illness. She was evaluated in the context of the global COVID-19 pandemic, which necessitated consideration that the patient might be at risk for infection with the SARS-CoV-2 virus that causes COVID-19. Institutional protocols and algorithms that pertain to the evaluation of patients at risk for COVID-19 are in a state of rapid change based on information released by regulatory bodies including the CDC and federal and state organizations. These policies and algorithms were followed during the patient's care in the ED.  Some ED evaluations and interventions may be delayed as a result of limited staffing during the pandemic.*  Note:  This document was  prepared using Dragon voice recognition software and may include unintentional dictation errors.   Gregor Hams, MD 08/05/19 [phone]

## 2019-08-04 NOTE — ED Notes (Signed)
Patient's eye appears reddened and watery.

## 2019-08-04 NOTE — ED Triage Notes (Signed)
Patient presents to the ED with painful left eye since Dec. 20th, patient was seen in the ED and then went to an eye doctor and has been using prescription eye drops q 2 hours.  Patient states her eye felt better for the first three days but then started to be painful again.  Patient states 1 hour ago she began to have severe sharp pain to her left eye as well as new vision loss to her left eye.  Patient is moaning and holding her head and triage.  Patient is very photophobic.  Patient reports taking ibuprofen and tylenol prior to arrival without improvement.

## 2019-08-05 MED ORDER — CYCLOPENTOLATE HCL 1 % OP SOLN: 1.0000 [drp] | Freq: Three times a day (TID) | OPHTHALMIC | 0 refills | Status: DC | PRN

## 2019-08-05 MED ORDER — PREDNISONE 20 MG PO TABS: 60.0000 mg | ORAL_TABLET | Freq: Every day | ORAL | 0 refills | Status: AC

## 2019-08-05 NOTE — ED Notes (Signed)
E-signature not working at this time. Pt verbalized understanding of D/C instructions, prescriptions and follow up care with no further questions at this time. Pt in NAD and ambulatory at time of D/C.  

## 2019-08-14 DIAGNOSIS — H402231 Chronic angle-closure glaucoma, bilateral, mild stage: Secondary | ICD-10-CM | POA: Insufficient documentation

## 2019-08-19 ENCOUNTER — Other Ambulatory Visit: Payer: Self-pay | Admitting: Family Medicine

## 2019-08-19 DIAGNOSIS — Z1231 Encounter for screening mammogram for malignant neoplasm of breast: Secondary | ICD-10-CM

## 2019-08-28 ENCOUNTER — Other Ambulatory Visit: Payer: Self-pay

## 2019-08-28 ENCOUNTER — Ambulatory Visit
Admission: RE | Admit: 2019-08-28 | Discharge: 2019-08-28 | Disposition: A | Discharge status: Home / Self Care | Payer: BC Managed Care – PPO | Source: Ambulatory Visit | Attending: Family Medicine | Admitting: Family Medicine

## 2019-08-28 DIAGNOSIS — Z1231 Encounter for screening mammogram for malignant neoplasm of breast: Secondary | ICD-10-CM | POA: Insufficient documentation

## 2019-08-28 IMAGING — MG DIGITAL SCREENING BILAT W/ TOMO W/ CAD
13 of 16 series · 13 of 32 positions shown · non-contrast
Comparison: Previous exam(s).

CLINICAL DATA: Screening.

EXAM:
DIGITAL SCREENING BILATERAL MAMMOGRAM WITH TOMO AND CAD

[R CC synth-2D]
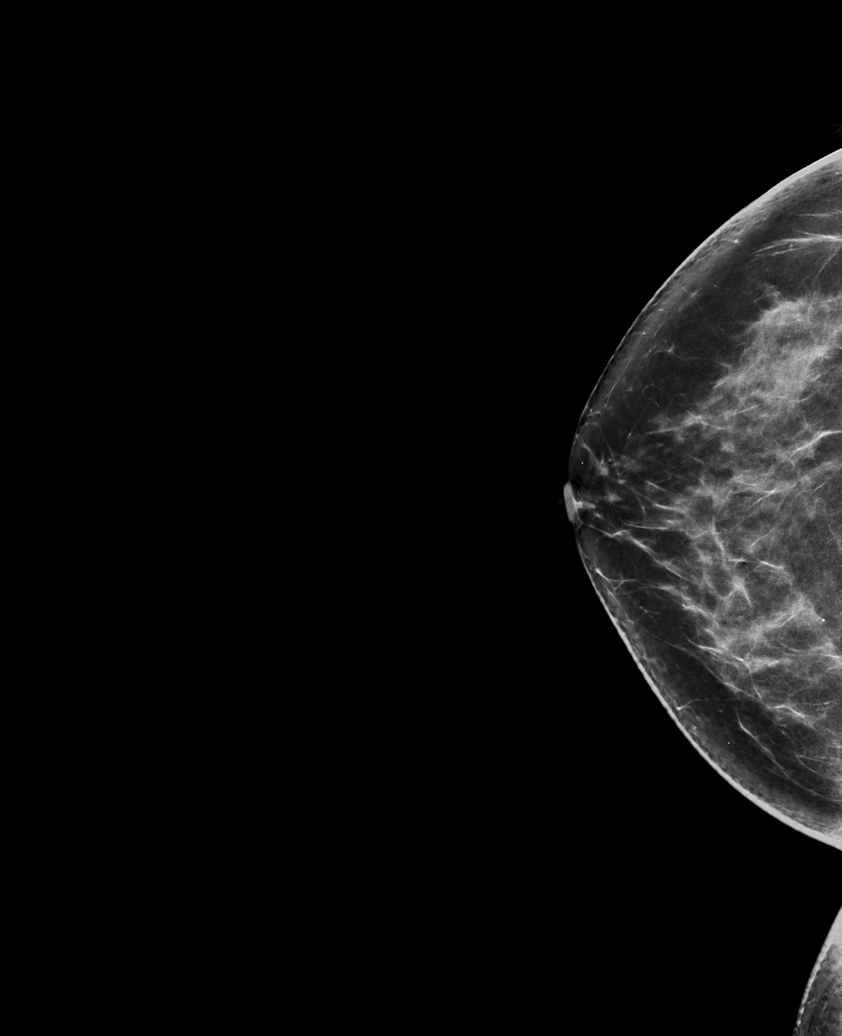

[R MLO synth-2D]
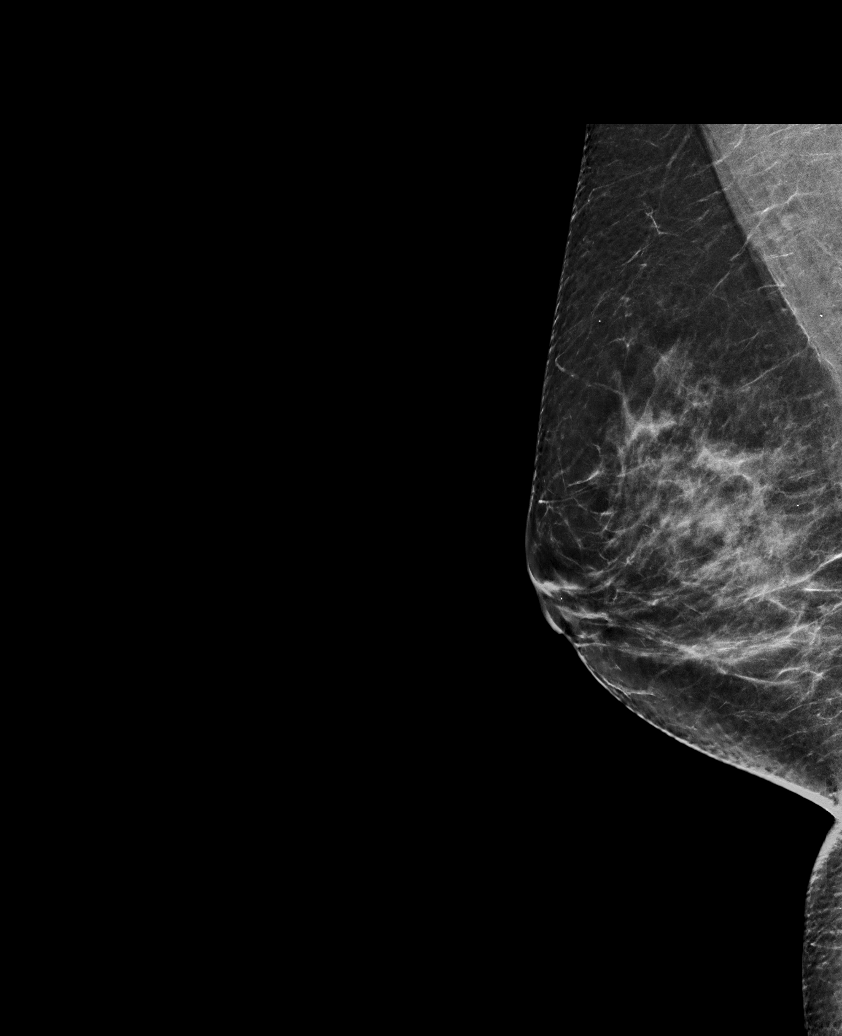

[L MLO synth-2D]
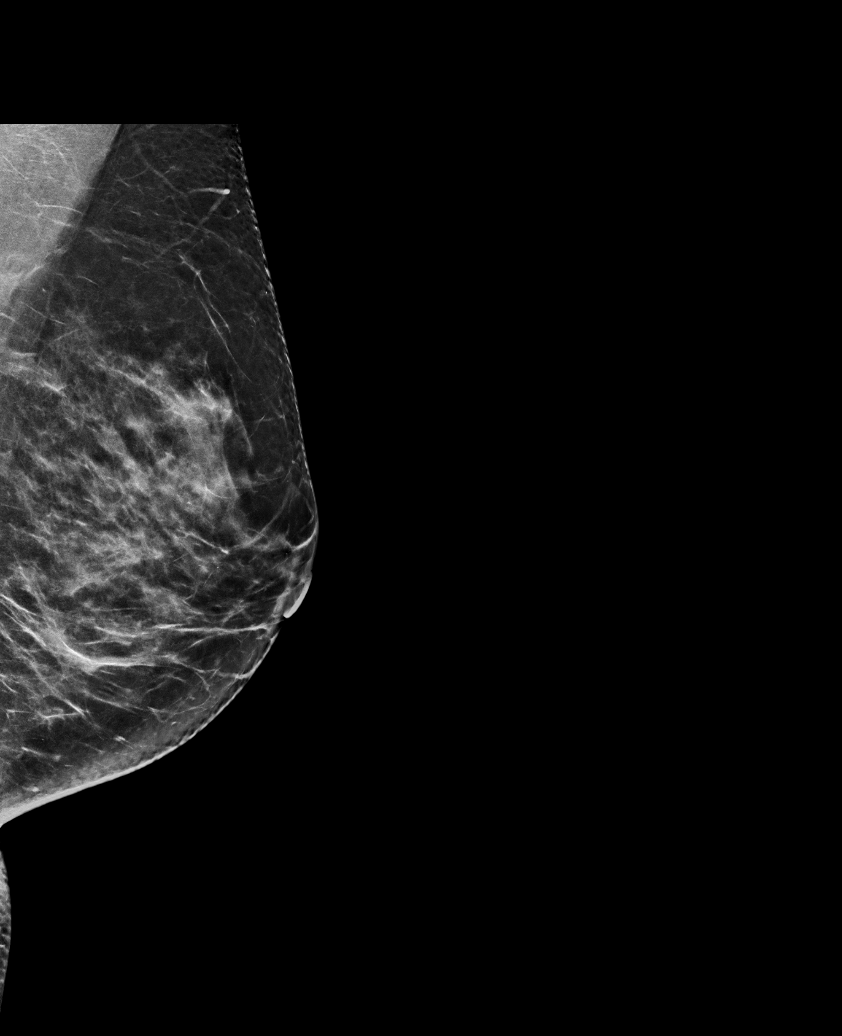

[L CC synth-2D]
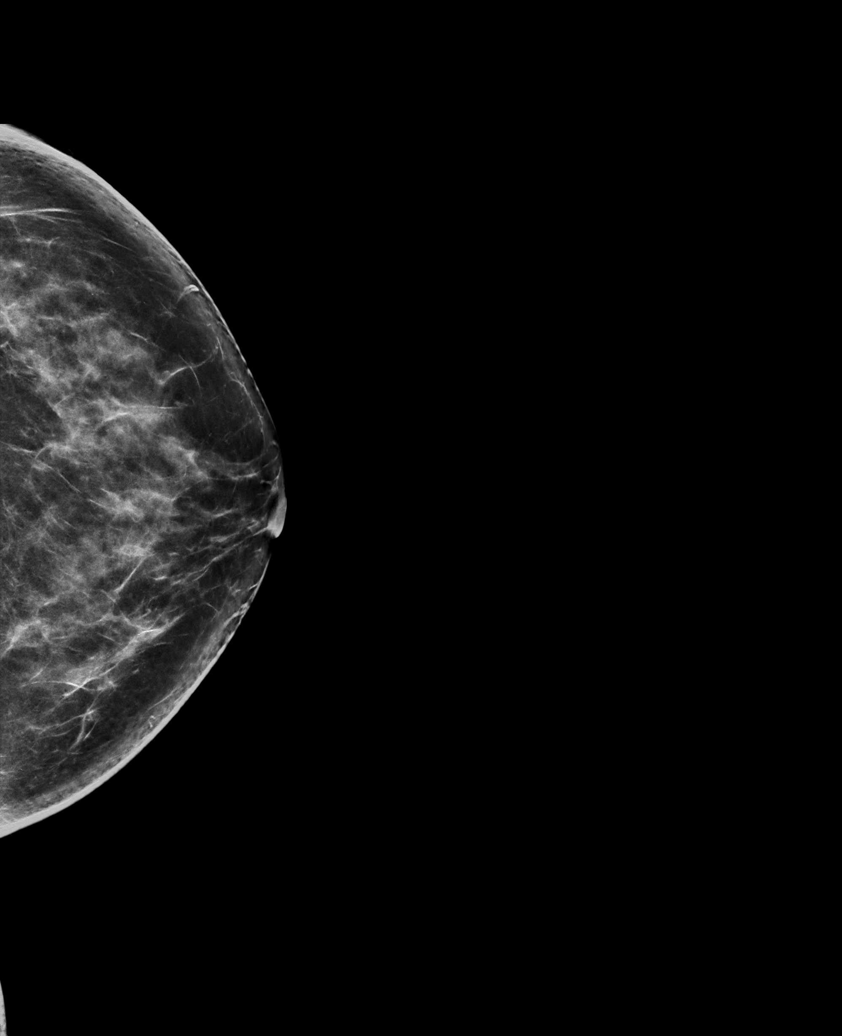

[L CC tomo (1 of 2)]
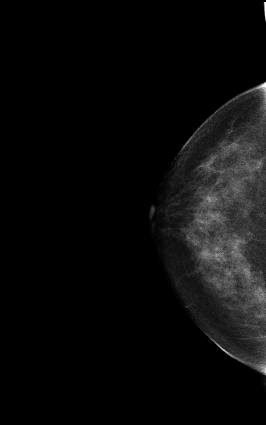

[R CC tomo (1 of 3)]
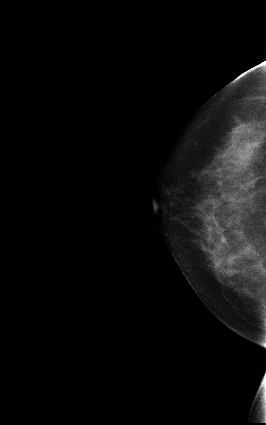

[R MLO tomo (1 of 2)]
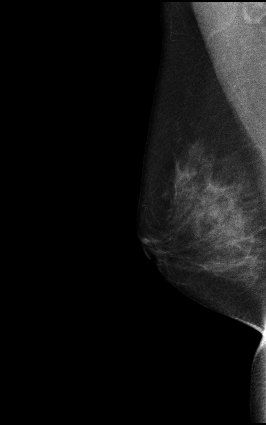

[L MLO tomo (1 of 2)]
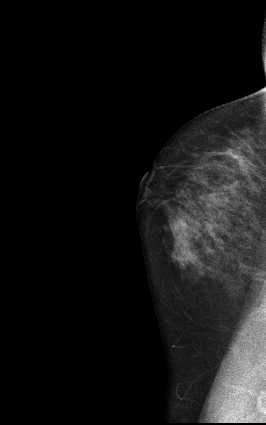

[R MLO tomo (2 of 2)]
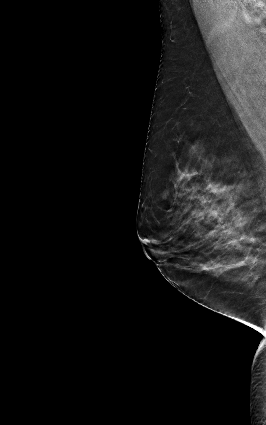

[L CC tomo (2 of 2)]
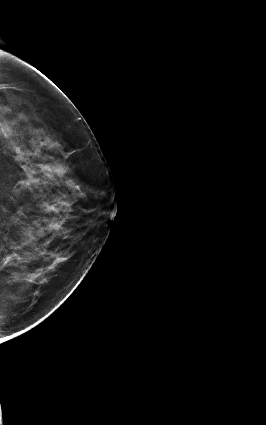

[R CC tomo (2 of 3)]
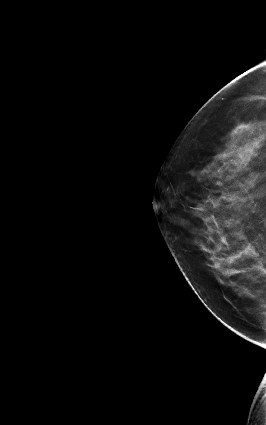

[L MLO tomo (2 of 2)]
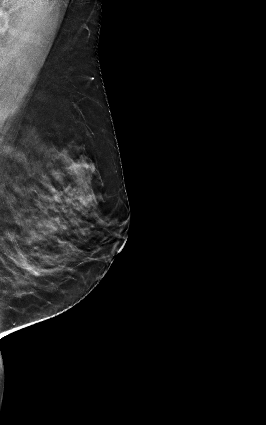

[R CC tomo (3 of 3) · tomo slice 41/82.0]
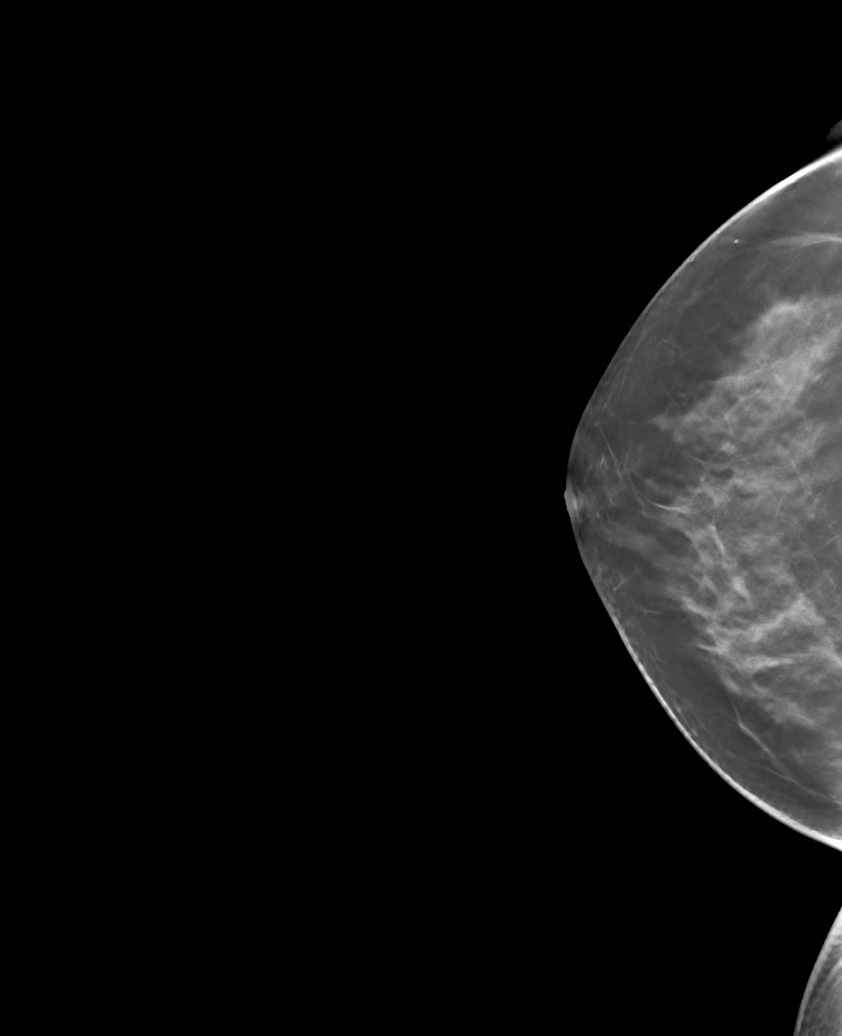

[13 of 32 positions shown; findings below may reference images not displayed]

ACR Breast Density Category c: The breast tissue is heterogeneously
dense, which may obscure small masses.
FINDINGS: There are no findings suspicious for malignancy. Images were
processed with CAD.
IMPRESSION: No mammographic evidence of malignancy. A result letter of this
screening mammogram will be mailed directly to the patient.

RECOMMENDATION:
Screening mammogram in one year. (Code:[5V])

BI-RADS CATEGORY  1: Negative.

## 2019-10-07 DIAGNOSIS — H2511 Age-related nuclear cataract, right eye: Secondary | ICD-10-CM | POA: Insufficient documentation

## 2019-10-07 DIAGNOSIS — Z961 Presence of intraocular lens: Secondary | ICD-10-CM | POA: Insufficient documentation

## 2020-03-28 ENCOUNTER — Other Ambulatory Visit: Payer: Self-pay

## 2020-03-28 ENCOUNTER — Ambulatory Visit
Admission: EM | Admit: 2020-03-28 | Discharge: 2020-03-28 | Disposition: A | Discharge status: Home / Self Care | Payer: BC Managed Care – PPO | Attending: Family Medicine | Admitting: Family Medicine

## 2020-03-28 ENCOUNTER — Encounter: Payer: Self-pay | Admitting: Emergency Medicine

## 2020-03-28 ENCOUNTER — Ambulatory Visit (INDEPENDENT_AMBULATORY_CARE_PROVIDER_SITE_OTHER): Payer: BC Managed Care – PPO

## 2020-03-28 DIAGNOSIS — R053 Chronic cough: Secondary | ICD-10-CM

## 2020-03-28 DIAGNOSIS — R05 Cough: Secondary | ICD-10-CM | POA: Diagnosis not present

## 2020-03-28 DIAGNOSIS — J45901 Unspecified asthma with (acute) exacerbation: Secondary | ICD-10-CM

## 2020-03-28 DIAGNOSIS — J45909 Unspecified asthma, uncomplicated: Secondary | ICD-10-CM

## 2020-03-28 IMAGING — CR DG CHEST 2V
2 series · 2 of 2 positions shown · non-contrast
Comparison: [DATE]

CLINICAL DATA: Cough for 6 weeks.  Known asthma

EXAM:
CHEST - 2 VIEW

[chest pa]
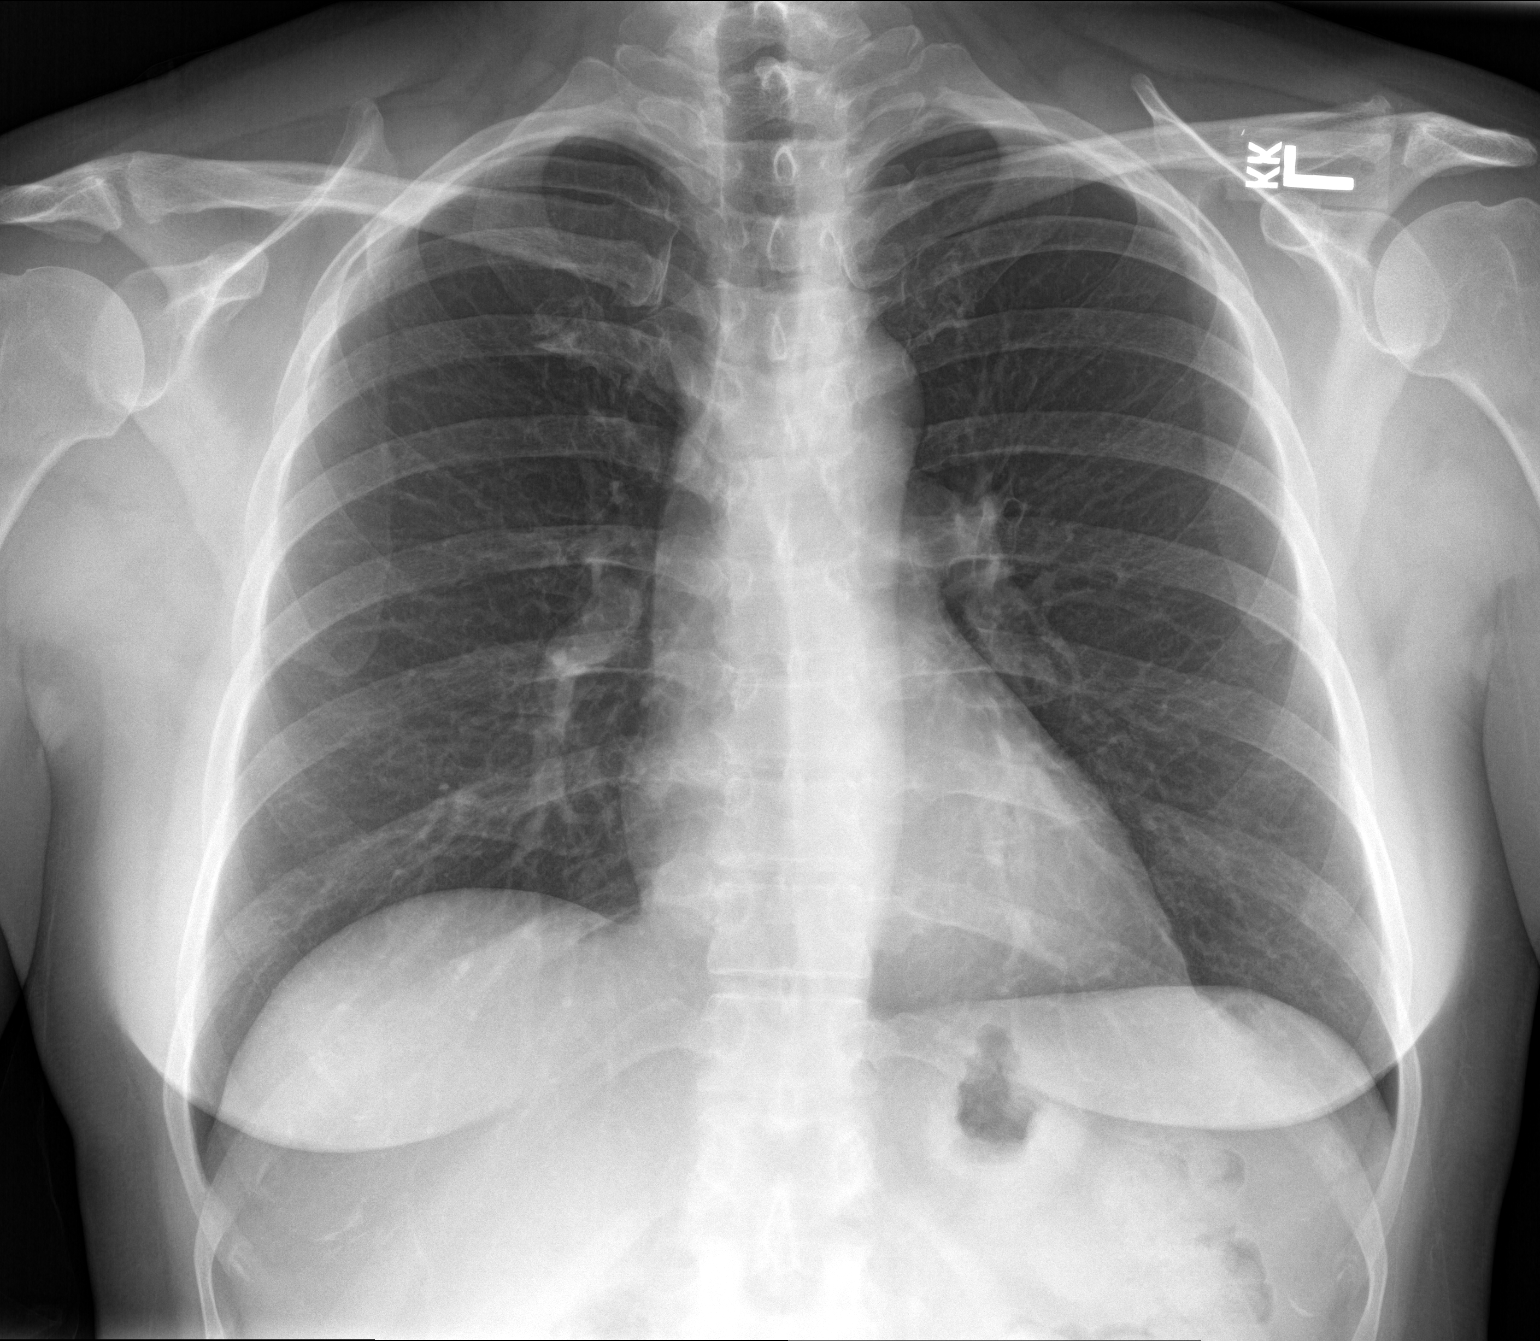

[chest lat]
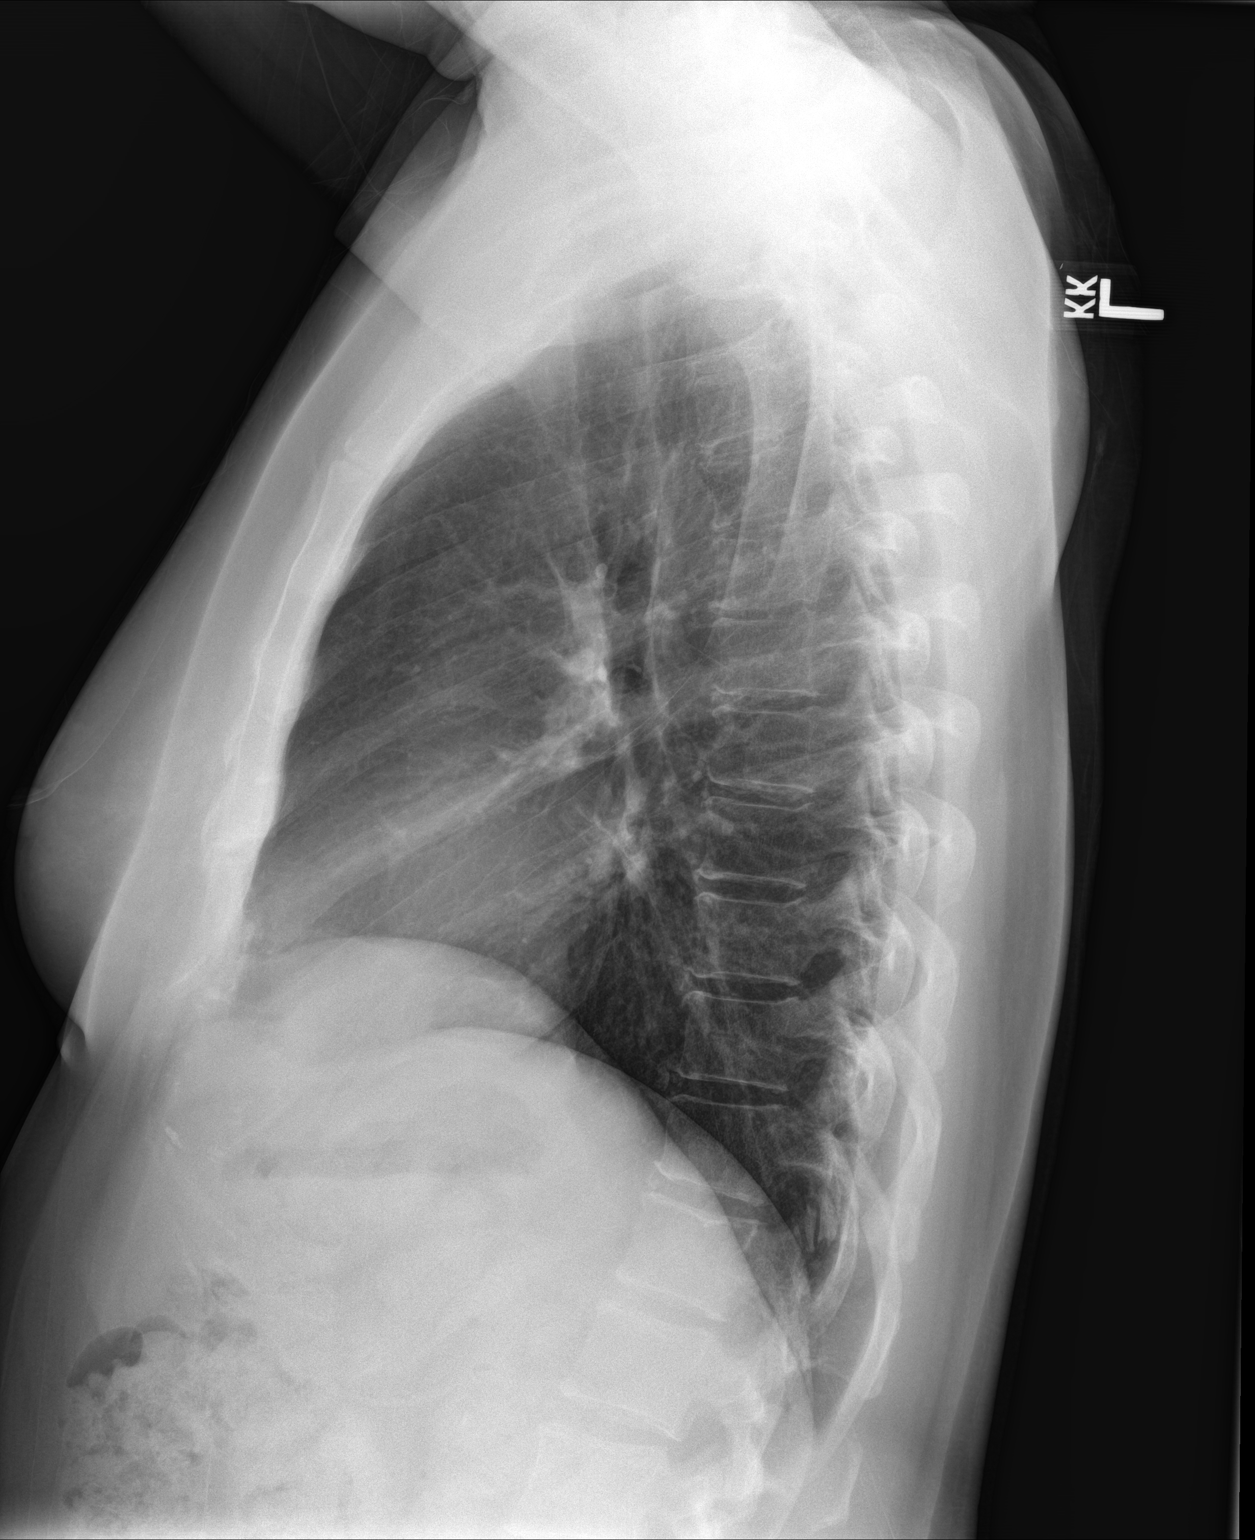

[2 of 2 positions shown; findings below may reference images not displayed]

FINDINGS: Normal heart size and mediastinal contours. No acute infiltrate or
edema. No effusion or pneumothorax. No acute osseous findings.
IMPRESSION: No active cardiopulmonary disease.

## 2020-03-28 MED ORDER — PREDNISONE 50 MG PO TABS: ORAL_TABLET | ORAL | 0 refills | Status: DC

## 2020-03-28 NOTE — ED Provider Notes (Signed)
[facility]    CSN: [id number] Arrival date & time: 03/28/20  7121  History   Chief Complaint Chief Complaint  Patient presents with  . Cough  . Chest Pain   HPI 54 year old female presents with the above complaints.   Patient reports ongoing cough for the past 6 weeks.  Associated chest discomfort from the cough.  Recently seen by her PCP on 8/12.  Was given Qvar inhaler as well as cough medication.  Patient reports that she has had no improvement.  Continues to have severe cough which is keeping her up at night.  No relieving factors.  No fever.  No other complaints.  Past Medical History:  Diagnosis Date  . Asthma    Past Surgical History:  Procedure Laterality Date  . CESAREAN SECTION    . HAMMER TOE SURGERY Right 02/25/2019   Procedure: HAMMER TOE CORRECTION;  Surgeon: Sharlotte Alamo, DPM;  Location: [facility] ORS;  Service: Podiatry;  Laterality: Right;  . HEMORROIDECTOMY      OB History   No obstetric history on file.      Home Medications    Prior to Admission medications   Medication Sig Start Date End Date Taking? Authorizing Provider  albuterol (VENTOLIN HFA) 108 (90 Base) MCG/ACT inhaler Inhale 2 puffs into the lungs every 6 (six) hours as needed for wheezing or shortness of breath.   Yes [provider]  Polyvinyl Alcohol-Povidone PF (REFRESH) 1.4-0.6 % SOLN Place 1 drop into both eyes daily as needed (for dry eyes).   Yes [provider]  predniSONE (DELTASONE) 50 MG tablet 1 tablet daily x 5 days 03/28/20   Coral Spikes, DO  QVAR REDIHALER 40 MCG/ACT inhaler Inhale 1 puff into the lungs 2 (two) times daily. 03/18/20   [provider]  montelukast (SINGULAIR) 10 MG tablet Take 10 mg by mouth at bedtime as needed.  03/28/20  [provider]    Family History Family History  Problem Relation Age of Onset  . Healthy Mother   . Alzheimer's disease Father   . Breast cancer Neg Hx     Social History Social History    Tobacco Use  . Smoking status: Never Smoker  . Smokeless tobacco: Never Used  Vaping Use  . Vaping Use: Never used  Substance Use Topics  . Alcohol use: No  . Drug use: No     Allergies   Pollen extract   Review of Systems Review of Systems  Constitutional: Negative for fever.  Respiratory: Positive for cough.    Physical Exam Triage Vital Signs ED Triage Vitals  Enc Vitals Group     BP 03/28/20 1027 118/76     Pulse Rate 03/28/20 1027 72     Resp 03/28/20 1027 18     Temp 03/28/20 1027 98.1 F (36.7 C)     Temp Source 03/28/20 1027 Oral     SpO2 03/28/20 1027 100 %     Weight 03/28/20 1027 160 lb (72.6 kg)     Height 03/28/20 1027 5\' 2"  (1.575 m)     Head Circumference --      Peak Flow --      Pain Score 03/28/20 1026 7     Pain Loc --      Pain Edu? --      Excl. in [place]? --    Updated Vital Signs BP 118/76 (BP Location: Left Arm)   Pulse 72   Temp 98.1 F (36.7 C) (  Oral)   Resp 18   Ht 5\' 2"  (1.575 m)   Wt 72.6 kg   LMP 09/06/2015 Comment: deneis preg, signed preg waiver  SpO2 100%   BMI 29.26 kg/m   Visual Acuity Right Eye Distance:   Left Eye Distance:   Bilateral Distance:    Right Eye Near:   Left Eye Near:    Bilateral Near:     Physical Exam Vitals and nursing note reviewed.  Constitutional:      General: She is not in acute distress.    Appearance: Normal appearance. She is not ill-appearing.  HENT:     Head: Normocephalic and atraumatic.  Eyes:     General:        Right eye: No discharge.        Left eye: No discharge.     Conjunctiva/sclera: Conjunctivae normal.  Cardiovascular:     Rate and Rhythm: Normal rate and regular rhythm.  Pulmonary:     Effort: Pulmonary effort is normal.     Breath sounds: No wheezing or rales.  Neurological:     Mental Status: She is alert.  Psychiatric:        Mood and Affect: Mood normal.        Behavior: Behavior normal.    UC Treatments / Results  Labs (all labs ordered are listed,  but only abnormal results are displayed) Labs Reviewed - No data to display  EKG Interpretation: Normal sinus rhythm at 62.  Normal axis.  Normal intervals.  Normal EKG.  Radiology DG Chest 2 View  Result Date: 03/28/2020 CLINICAL DATA:  Cough for 6 weeks.  Known asthma EXAM: CHEST - 2 VIEW COMPARISON:  11/20/2018 FINDINGS: Normal heart size and mediastinal contours. No acute infiltrate or edema. No effusion or pneumothorax. No acute osseous findings. IMPRESSION: No active cardiopulmonary disease. Electronically Signed   By: Monte Fantasia M.D.   On: 03/28/2020 11:14    Procedures Procedures (including critical care time)  Medications Ordered in UC Medications - No data to display  Initial Impression / Assessment and Plan / UC Course  I have reviewed the triage vital signs and the nursing notes.  Pertinent labs & imaging results that were available during my care of the patient were reviewed by me and considered in my medical decision making (see chart for details).    54 year old female presents with ongoing cough. This appears to be due to Asthma/Asthma exacerbation. Chest xray obtained and was negative. Treating with prednisone. Advised follow up with Pulmonology.   Final Clinical Impressions(s) / UC Diagnoses   Final diagnoses:  Chronic cough  Asthma with acute exacerbation, unspecified asthma severity, unspecified whether persistent     Discharge Instructions     Medication as prescribed.  Call Pulmonology for follow up.  Take care  Dr. Lacinda Axon    ED Prescriptions    Medication Sig Dispense Auth. Provider   predniSONE (DELTASONE) 50 MG tablet 1 tablet daily x 5 days 5 tablet Thersa Salt G, DO     PDMP not reviewed this encounter.   Coral Spikes, [place] 03/28/20 2251

## 2020-03-28 NOTE — ED Triage Notes (Signed)
Patient in today c/o cough x 6 weeks. Patient states she has pain in her chest that goes through to her back x 3 days, getting worse. Patient was seen ~2 weeks ago by PCP and given an inhaler, but patient states it hasn't helped. Patient states she had an old prescription of amoxicillin at home that she has been taking x 4 days. Patient has had the covid vaccine.

## 2020-03-28 NOTE — Discharge Instructions (Signed)
Medication as prescribed.  Call Pulmonology for follow up.  Take care  Dr. Lacinda Axon

## 2020-05-05 ENCOUNTER — Ambulatory Visit
Admission: RE | Admit: 2020-05-05 | Discharge: 2020-05-05 | Disposition: A | Discharge status: Home / Self Care | Payer: BC Managed Care – PPO | Attending: Family Medicine | Admitting: Family Medicine

## 2020-05-05 ENCOUNTER — Ambulatory Visit
Admission: RE | Admit: 2020-05-05 | Discharge: 2020-05-05 | Disposition: A | Discharge status: Home / Self Care | Payer: BC Managed Care – PPO | Source: Ambulatory Visit | Attending: Family Medicine | Admitting: Family Medicine

## 2020-05-05 ENCOUNTER — Other Ambulatory Visit: Payer: Self-pay

## 2020-05-05 ENCOUNTER — Other Ambulatory Visit: Payer: Self-pay | Admitting: Family Medicine

## 2020-05-05 DIAGNOSIS — R05 Cough: Secondary | ICD-10-CM | POA: Diagnosis present

## 2020-05-05 DIAGNOSIS — R059 Cough, unspecified: Secondary | ICD-10-CM

## 2020-05-05 IMAGING — CR DG CHEST 2V
2 series · 2 of 2 positions shown · non-contrast
Comparison: [DATE]

CLINICAL DATA: Cough greater than 4 weeks. Chest pain radiating
posteriorly.

EXAM:
CHEST - 2 VIEW

[chest pa]
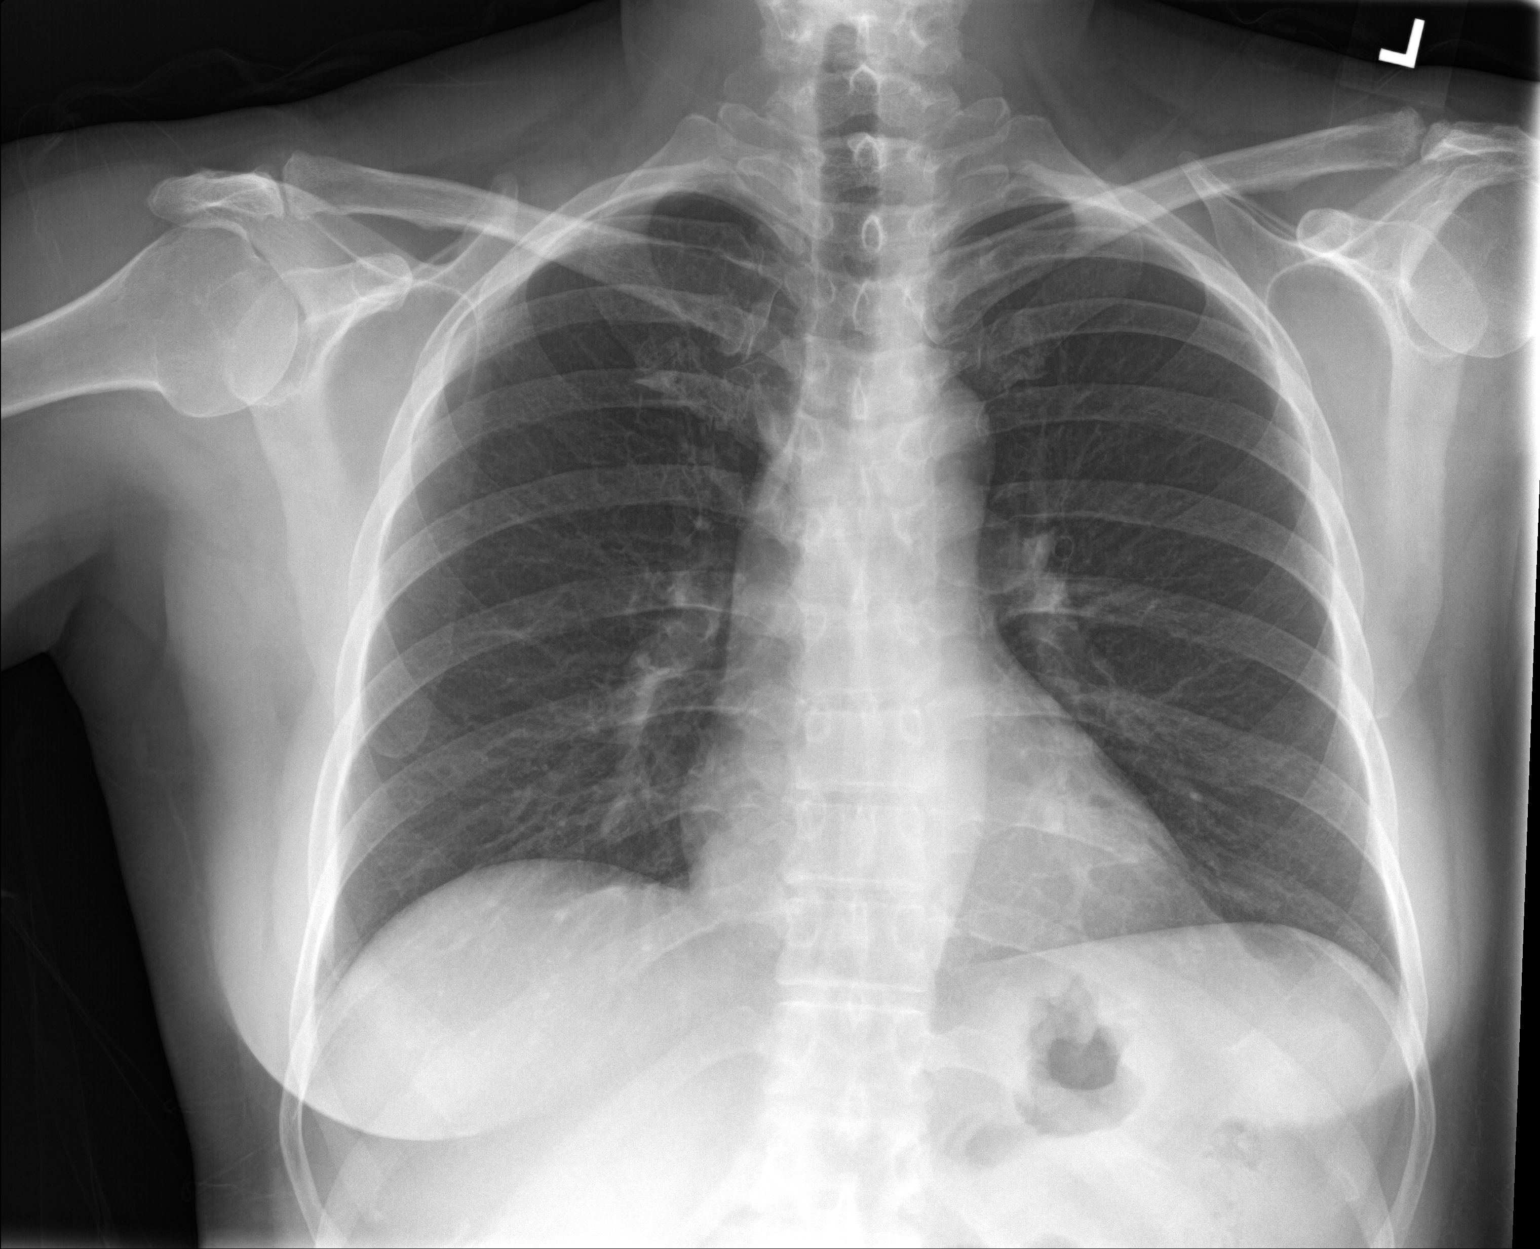

[chest lat]
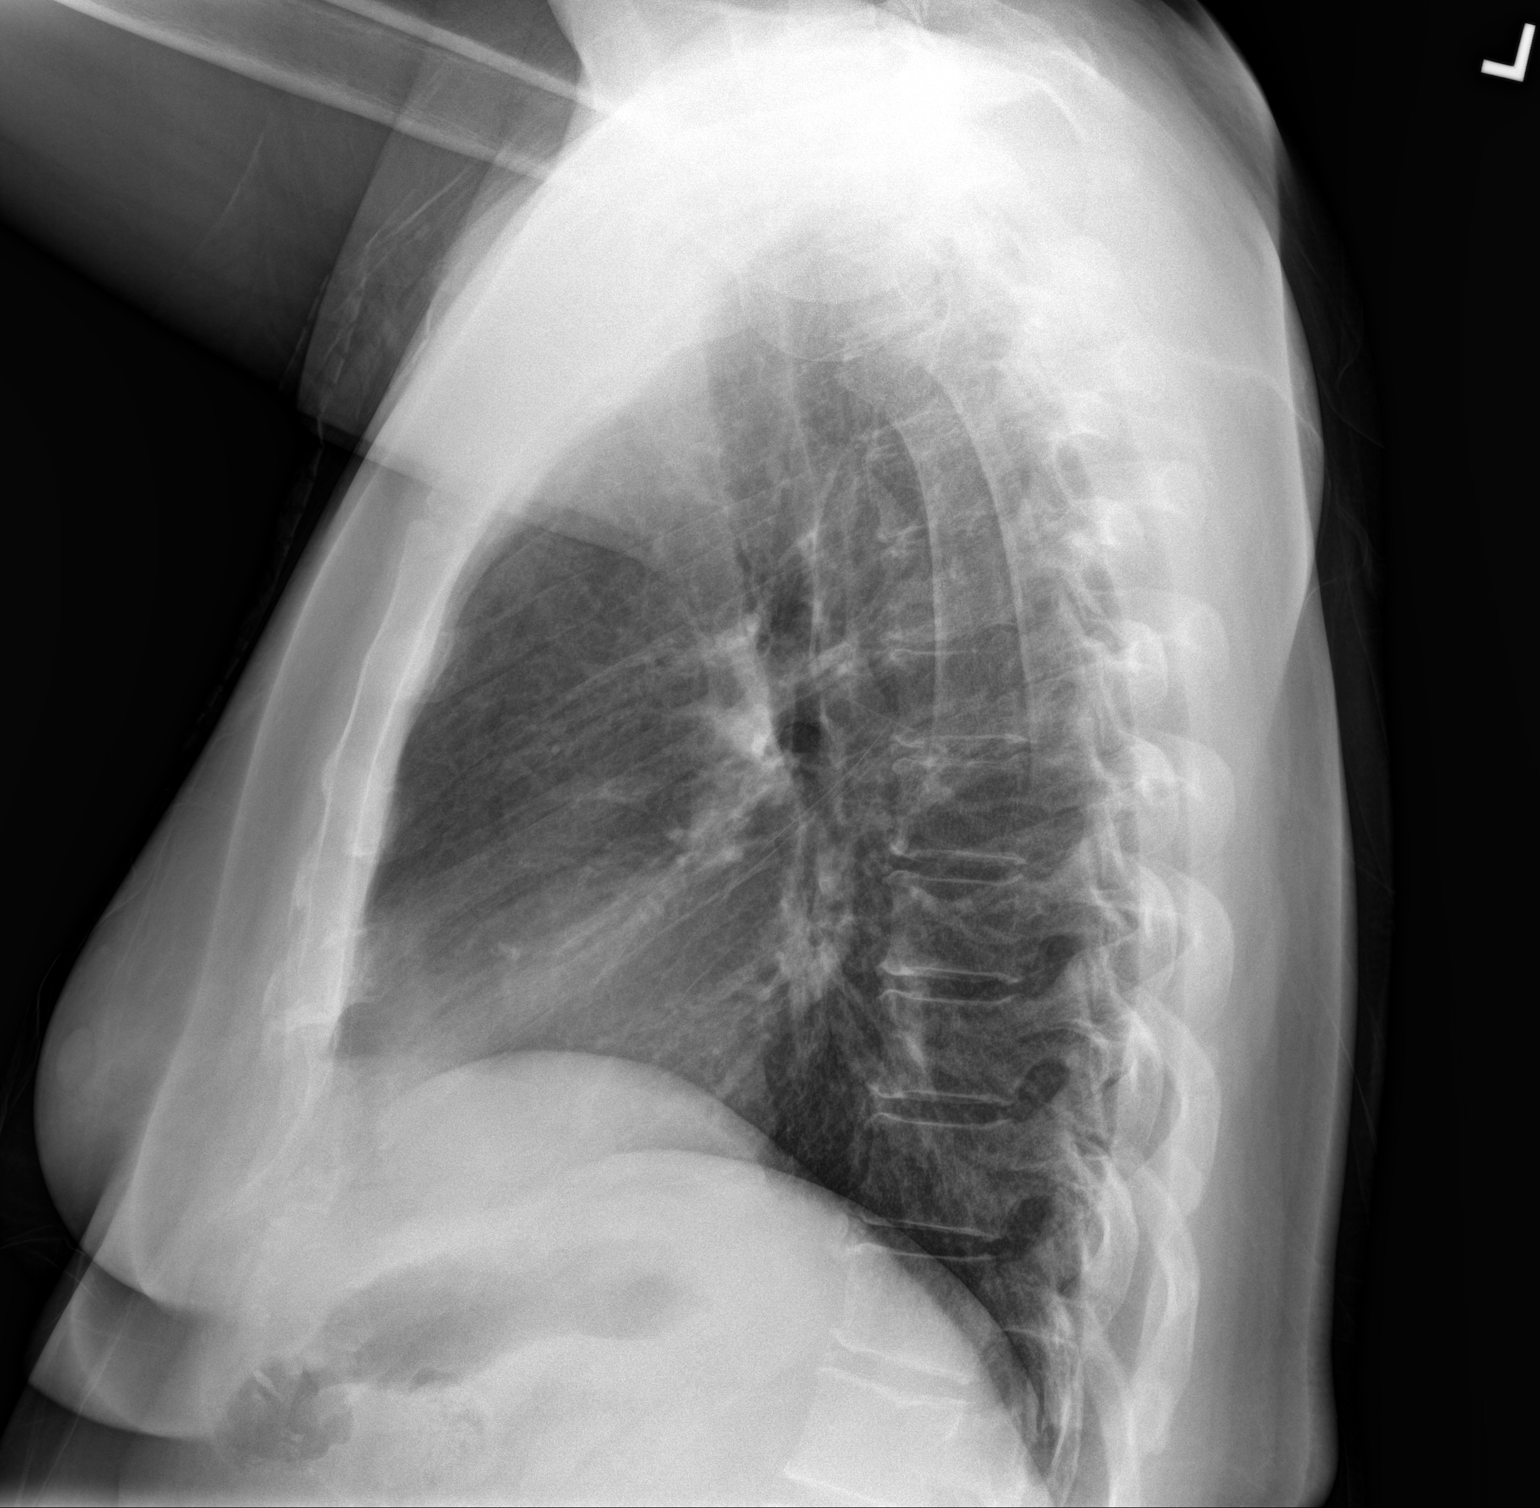

[2 of 2 positions shown; findings below may reference images not displayed]

FINDINGS: The heart size and mediastinal contours are within normal limits.
Both lungs are clear. The visualized skeletal structures are
unremarkable.
IMPRESSION: No active cardiopulmonary disease.

## 2020-05-12 ENCOUNTER — Other Ambulatory Visit: Payer: BC Managed Care – PPO

## 2020-05-13 ENCOUNTER — Other Ambulatory Visit: Payer: Self-pay

## 2020-05-13 ENCOUNTER — Other Ambulatory Visit
Admission: RE | Admit: 2020-05-13 | Discharge: 2020-05-13 | Disposition: A | Discharge status: Home / Self Care | Payer: BC Managed Care – PPO | Source: Ambulatory Visit | Attending: Gastroenterology | Admitting: Gastroenterology

## 2020-05-13 DIAGNOSIS — Z01812 Encounter for preprocedural laboratory examination: Secondary | ICD-10-CM | POA: Diagnosis present

## 2020-05-13 DIAGNOSIS — Z20822 Contact with and (suspected) exposure to covid-19: Secondary | ICD-10-CM | POA: Diagnosis not present

## 2020-05-13 LAB — SARS CORONAVIRUS 2 (TAT 6-24 HRS): SARS Coronavirus 2: NEGATIVE

## 2020-05-14 ENCOUNTER — Ambulatory Visit
Admission: RE | Admit: 2020-05-14 | Discharge: 2020-05-14 | Disposition: A | Discharge status: Home / Self Care | Payer: BC Managed Care – PPO | Attending: Gastroenterology | Admitting: Gastroenterology

## 2020-05-14 ENCOUNTER — Encounter: Payer: Self-pay | Admitting: *Deleted

## 2020-05-14 ENCOUNTER — Encounter
Admission: RE | Disposition: A | Discharge status: Home / Self Care | Payer: Self-pay | Source: Home / Self Care | Attending: Gastroenterology

## 2020-05-14 ENCOUNTER — Ambulatory Visit: Payer: BC Managed Care – PPO | Admitting: Certified Registered Nurse Anesthetist

## 2020-05-14 DIAGNOSIS — R1013 Epigastric pain: Secondary | ICD-10-CM | POA: Insufficient documentation

## 2020-05-14 DIAGNOSIS — K2951 Unspecified chronic gastritis with bleeding: Secondary | ICD-10-CM | POA: Insufficient documentation

## 2020-05-14 DIAGNOSIS — Z7951 Long term (current) use of inhaled steroids: Secondary | ICD-10-CM | POA: Diagnosis not present

## 2020-05-14 DIAGNOSIS — B9681 Helicobacter pylori [H. pylori] as the cause of diseases classified elsewhere: Secondary | ICD-10-CM | POA: Diagnosis not present

## 2020-05-14 DIAGNOSIS — J45909 Unspecified asthma, uncomplicated: Secondary | ICD-10-CM | POA: Diagnosis not present

## 2020-05-14 DIAGNOSIS — K219 Gastro-esophageal reflux disease without esophagitis: Secondary | ICD-10-CM | POA: Diagnosis not present

## 2020-05-14 HISTORY — PX: ESOPHAGOGASTRODUODENOSCOPY (EGD) WITH PROPOFOL: SHX5813

## 2020-05-14 SURGERY — ESOPHAGOGASTRODUODENOSCOPY (EGD) WITH PROPOFOL
Anesthesia: General

## 2020-05-14 MED ORDER — PROPOFOL 500 MG/50ML IV EMUL
INTRAVENOUS | Status: AC
  Filled 2020-05-14: qty 50

## 2020-05-14 MED ORDER — LIDOCAINE HCL (PF) 2 % IJ SOLN
INTRAMUSCULAR | Status: AC
  Filled 2020-05-14: qty 5

## 2020-05-14 MED ORDER — SODIUM CHLORIDE 0.9 % IV SOLN: INTRAVENOUS | Status: DC

## 2020-05-14 MED ORDER — PROPOFOL 10 MG/ML IV BOLUS
INTRAVENOUS | Status: DC | PRN
  Administered 2020-05-14: 80 mg via INTRAVENOUS

## 2020-05-14 MED ORDER — PROPOFOL 500 MG/50ML IV EMUL
INTRAVENOUS | Status: DC | PRN
  Administered 2020-05-14: 140 ug/kg/min via INTRAVENOUS

## 2020-05-14 MED ORDER — LIDOCAINE HCL (CARDIAC) PF 100 MG/5ML IV SOSY
PREFILLED_SYRINGE | INTRAVENOUS | Status: DC | PRN
  Administered 2020-05-14: 50 mg via INTRAVENOUS

## 2020-05-14 NOTE — Interval H&P Note (Signed)
History and Physical Interval Note:  05/14/2020 9:04 AM  Joanna Keith  has presented today for surgery, with the diagnosis of GERD.  The various methods of treatment have been discussed with the patient and family. After consideration of risks, benefits and other options for treatment, the patient has consented to  Procedure(s): ESOPHAGOGASTRODUODENOSCOPY (EGD) WITH PROPOFOL (N/A) as a surgical intervention.  The patient's history has been reviewed, patient examined, no change in status, stable for surgery.  I have reviewed the patient's chart and labs.  Questions were answered to the patient's satisfaction.     Lesly Rubenstein  Ok to proceed with EGD

## 2020-05-14 NOTE — Transfer of Care (Signed)
Immediate Anesthesia Transfer of Care Note  Patient: Joanna Keith  Procedure(s) Performed: ESOPHAGOGASTRODUODENOSCOPY (EGD) WITH PROPOFOL (N/A )  Patient Location: PACU and Endoscopy Unit  Anesthesia Type:General  Level of Consciousness: drowsy  Airway & Oxygen Therapy: Patient Spontanous Breathing  Post-op Assessment: Report given to RN and Post -op Vital signs reviewed and stable  Post vital signs: Reviewed and stable  Last Vitals:  Vitals Value Taken Time  BP 115/69 05/14/20 0922  Temp    Pulse 73 05/14/20 0922  Resp 19 05/14/20 0922  SpO2 96 % 05/14/20 0922  Vitals shown include unvalidated device data.  Last Pain:  Vitals:   05/14/20 0810  TempSrc: Temporal  PainSc: 0-No pain         Complications: No complications documented.

## 2020-05-14 NOTE — H&P (Signed)
Outpatient short stay form Pre-procedure 05/14/2020 9:01 AM Raylene Miyamoto MD, MPH  Primary Physician: Dr. Kym Groom  Reason for visit:  Epigastric pain  History of present illness:   54 y/o lady with history of NSAID use here for EGD due to epigastric pain, heart burn, dyspepsia. No blood thinners. No family history of GI malignancies. History of c-section. She does not think a PPI is helping.    Current Facility-Administered Medications:    0.9 %  sodium chloride infusion, , Intravenous, Continuous, Amran Malter, Hilton Cork, MD, Last Rate: 20 mL/hr at 05/14/20 0839, Continued from Pre-op at 05/14/20 0839  Medications Prior to Admission  Medication Sig Dispense Refill Last Dose   albuterol (VENTOLIN HFA) 108 (90 Base) MCG/ACT inhaler Inhale 2 puffs into the lungs every 6 (six) hours as needed for wheezing or shortness of breath.   Past Week at Unknown time   pantoprazole (PROTONIX) 40 MG tablet Take 40 mg by mouth daily. (Patient not taking: Reported on 05/14/2020)   Not Taking at Unknown time   Polyvinyl Alcohol-Povidone PF (REFRESH) 1.4-0.6 % SOLN Place 1 drop into both eyes daily as needed (for dry eyes).      predniSONE (DELTASONE) 50 MG tablet 1 tablet daily x 5 days (Patient not taking: Reported on 05/14/2020) 5 tablet 0 Completed Course at Unknown time   QVAR REDIHALER 40 MCG/ACT inhaler Inhale 1 puff into the lungs 2 (two) times daily.        Allergies  Allergen Reactions   Pollen Extract Itching     Past Medical History:  Diagnosis Date   Asthma     Review of systems:  Otherwise negative.    Physical Exam  Gen: Alert, oriented. Appears stated age.  HEENT: [place]/AT. PERRLA. Lungs: No respiratory distress Abd: soft, benign, no masses.  Ext: No edema.     Planned procedures: Proceed with EGD. The patient understands the nature of the planned procedure, indications, risks, alternatives and potential complications including but not limited to bleeding, infection,  perforation, damage to internal organs and possible oversedation/side effects from anesthesia. The patient agrees and gives consent to proceed.  Please refer to procedure notes for findings, recommendations and patient disposition/instructions.     Raylene Miyamoto MD, MPH Gastroenterology 05/14/2020  9:01 AM

## 2020-05-14 NOTE — Anesthesia Preprocedure Evaluation (Signed)
Anesthesia Evaluation  Patient identified by MRN, date of birth, ID band Patient awake    Reviewed: Allergy & Precautions, H&P , NPO status , Patient's Chart, lab work & pertinent test results, reviewed documented beta blocker date and time   History of Anesthesia Complications Negative for: history of anesthetic complications  Airway Mallampati: II  TM Distance: >3 FB Neck ROM: full    Dental  (+) Dental Advidsory Given, Caps, Teeth Intact   Pulmonary neg shortness of breath, asthma , neg recent URI,    Pulmonary exam normal breath sounds clear to auscultation       Cardiovascular Exercise Tolerance: Good negative cardio ROS Normal cardiovascular exam Rhythm:regular Rate:Normal     Neuro/Psych negative neurological ROS  negative psych ROS   GI/Hepatic Neg liver ROS, GERD  ,  Endo/Other  negative endocrine ROS  Renal/GU negative Renal ROS  negative genitourinary   Musculoskeletal   Abdominal   Peds  Hematology negative hematology ROS (+)   Anesthesia Other Findings Past Medical History: No date: Asthma   Reproductive/Obstetrics negative OB ROS                             Anesthesia Physical Anesthesia Plan  ASA: II  Anesthesia Plan: General   Post-op Pain Management:    Induction: Intravenous  PONV Risk Score and Plan: 3 and Propofol infusion and TIVA  Airway Management Planned: Natural Airway and Nasal Cannula  Additional Equipment:   Intra-op Plan:   Post-operative Plan:   Informed Consent: I have reviewed the patients History and Physical, chart, labs and discussed the procedure including the risks, benefits and alternatives for the proposed anesthesia with the patient or authorized representative who has indicated his/her understanding and acceptance.     Dental Advisory Given  Plan Discussed with: Anesthesiologist, CRNA and Surgeon  Anesthesia Plan Comments:          Anesthesia Quick Evaluation

## 2020-05-14 NOTE — Op Note (Signed)
[facility] Gastroenterology Patient Name: Joanna Keith Procedure Date: 05/14/2020 9:06 AM MRN: [id number] Account #: [id number] Date of Birth: [birth year] Admit Type: Outpatient Age: 54 Room: [facility] ENDO ROOM 3 Gender: Female Note Status: Finalized Procedure:             Upper GI endoscopy Indications:           Epigastric abdominal pain, Gastro-esophageal reflux                         disease Providers:             Andrey Farmer MD, MD Referring MD:          Wynona Canes. Kym Groom, MD (Referring MD) Medicines:             Monitored Anesthesia Care Complications:         No immediate complications. Estimated blood loss:                         Minimal. Procedure:             Pre-Anesthesia Assessment:                        - Prior to the procedure, a History and Physical was                         performed, and patient medications and allergies were                         reviewed. The patient is competent. The risks and                         benefits of the procedure and the sedation options and                         risks were discussed with the patient. All questions                         were answered and informed consent was obtained.                         Patient identification and proposed procedure were                         verified by the physician, the nurse, the anesthetist                         and the technician in the endoscopy suite. Mental                         Status Examination: alert and oriented. Airway                         Examination: normal oropharyngeal airway and neck                         mobility. Respiratory Examination: clear to                         auscultation.  CV Examination: normal. Prophylactic                         Antibiotics: The patient does not require prophylactic                         antibiotics. Prior Anticoagulants: The patient has                         taken no previous anticoagulant or  antiplatelet                         agents. ASA Grade Assessment: II - A patient with mild                         systemic disease. After reviewing the risks and                         benefits, the patient was deemed in satisfactory                         condition to undergo the procedure. The anesthesia                         plan was to use monitored anesthesia care (MAC).                         Immediately prior to administration of medications,                         the patient was re-assessed for adequacy to receive                         sedatives. The heart rate, respiratory rate, oxygen                         saturations, blood pressure, adequacy of pulmonary                         ventilation, and response to care were monitored                         throughout the procedure. The physical status of the                         patient was re-assessed after the procedure.                        After obtaining informed consent, the endoscope was                         passed under direct vision. Throughout the procedure,                         the patient's blood pressure, pulse, and oxygen                         saturations were monitored continuously. The Endoscope  was introduced through the mouth, and advanced to the                         second part of duodenum. The upper GI endoscopy was                         accomplished without difficulty. The patient tolerated                         the procedure well. Findings:      The examined esophagus was normal.      Diffuse moderate inflammation with hemorrhage characterized by       congestion (edema), erythema and granularity was found in the gastric       body. Biopsies were taken with a cold forceps for histology. Estimated       blood loss was minimal.      The exam of the stomach was otherwise normal.      Biopsies were taken with a cold forceps in the entire examined stomach        for Helicobacter pylori testing. Estimated blood loss was minimal.      The examined duodenum was normal. Impression:            - Normal esophagus.                        - Chronic gastritis with hemorrhage. Biopsied.                        - Normal examined duodenum.                        - Biopsies were taken with a cold forceps for                         Helicobacter pylori testing. Recommendation:        - Discharge patient to home.                        - Resume previous diet.                        - Continue present medications.                        - Await pathology results.                        - Return to referring physician as previously                         scheduled. Procedure Code(s):     --- Professional ---                        [id number], Esophagogastroduodenoscopy, flexible,                         transoral; with biopsy, single or multiple Diagnosis Code(s):     --- Professional ---                        K29.51, Unspecified chronic  gastritis with bleeding                        R10.13, Epigastric pain                        K21.9, Gastro-esophageal reflux disease without                         esophagitis CPT copyright 2019 American Medical Association. All rights reserved. The codes documented in this report are preliminary and upon coder review may  be revised to meet current compliance requirements. Andrey Farmer, MD Andrey Farmer MD, MD 05/14/2020 9:22:13 AM Number of Addenda: 0 Note Initiated On: 05/14/2020 9:06 AM Estimated Blood Loss:  Estimated blood loss was minimal.      [facility]

## 2020-05-15 NOTE — Anesthesia Postprocedure Evaluation (Signed)
Anesthesia Post Note  Patient: Joanna Keith  Procedure(s) Performed: ESOPHAGOGASTRODUODENOSCOPY (EGD) WITH PROPOFOL (N/A )  Patient location during evaluation: Endoscopy Anesthesia Type: General Level of consciousness: awake and alert Pain management: pain level controlled Vital Signs Assessment: post-procedure vital signs reviewed and stable Respiratory status: spontaneous breathing, nonlabored ventilation, respiratory function stable and patient connected to nasal cannula oxygen Cardiovascular status: blood pressure returned to baseline and stable Postop Assessment: no apparent nausea or vomiting Anesthetic complications: no   No complications documented.   Last Vitals:  Vitals:   05/14/20 0940 05/14/20 0950  BP: 121/83 114/74  Pulse: (!) 55 (!) 55  Resp: 16 14  Temp:    SpO2: 100% 99%    Last Pain:  Vitals:   05/14/20 0920  TempSrc: Temporal  PainSc:                  Martha Clan

## 2020-05-17 ENCOUNTER — Encounter: Payer: Self-pay | Admitting: Gastroenterology

## 2020-05-17 LAB — SURGICAL PATHOLOGY

## 2021-03-10 ENCOUNTER — Other Ambulatory Visit: Payer: Self-pay | Admitting: Family Medicine

## 2021-03-10 DIAGNOSIS — Z1231 Encounter for screening mammogram for malignant neoplasm of breast: Secondary | ICD-10-CM

## 2021-03-29 ENCOUNTER — Other Ambulatory Visit: Payer: Self-pay

## 2021-03-29 ENCOUNTER — Ambulatory Visit
Admission: RE | Admit: 2021-03-29 | Discharge: 2021-03-29 | Disposition: A | Discharge status: Home / Self Care | Payer: BC Managed Care – PPO | Source: Ambulatory Visit | Attending: Family Medicine | Admitting: Family Medicine

## 2021-03-29 DIAGNOSIS — Z1231 Encounter for screening mammogram for malignant neoplasm of breast: Secondary | ICD-10-CM | POA: Insufficient documentation

## 2021-03-29 IMAGING — MG MM DIGITAL SCREENING BILAT W/ TOMO AND CAD
8 series · 8 of 24 positions shown · non-contrast
Comparison: Previous exam(s).

CLINICAL DATA: Screening.

EXAM:
DIGITAL SCREENING BILATERAL MAMMOGRAM WITH TOMOSYNTHESIS AND CAD
TECHNIQUE: Bilateral screening digital craniocaudal and mediolateral oblique
mammograms were obtained. Bilateral screening digital breast
tomosynthesis was performed. The images were evaluated with
computer-aided detection.

[R MLO synth-2D]
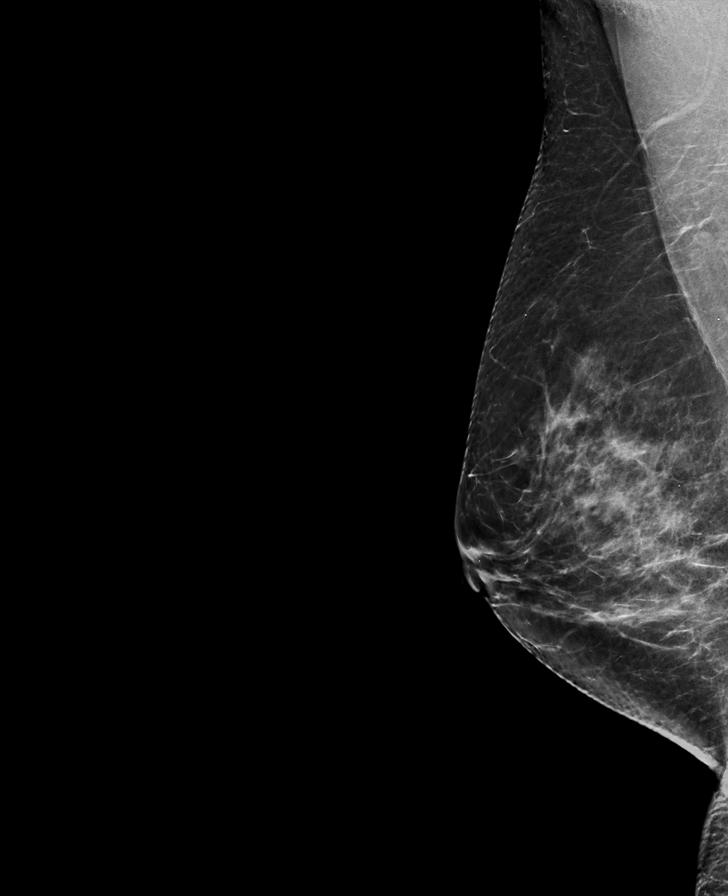

[L CC synth-2D]
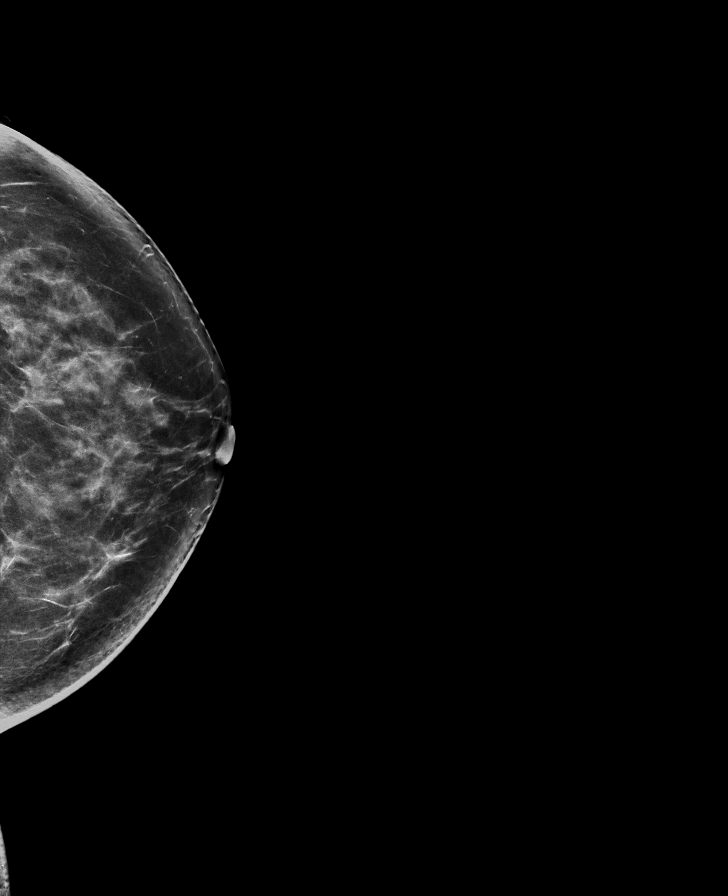

[L MLO synth-2D]
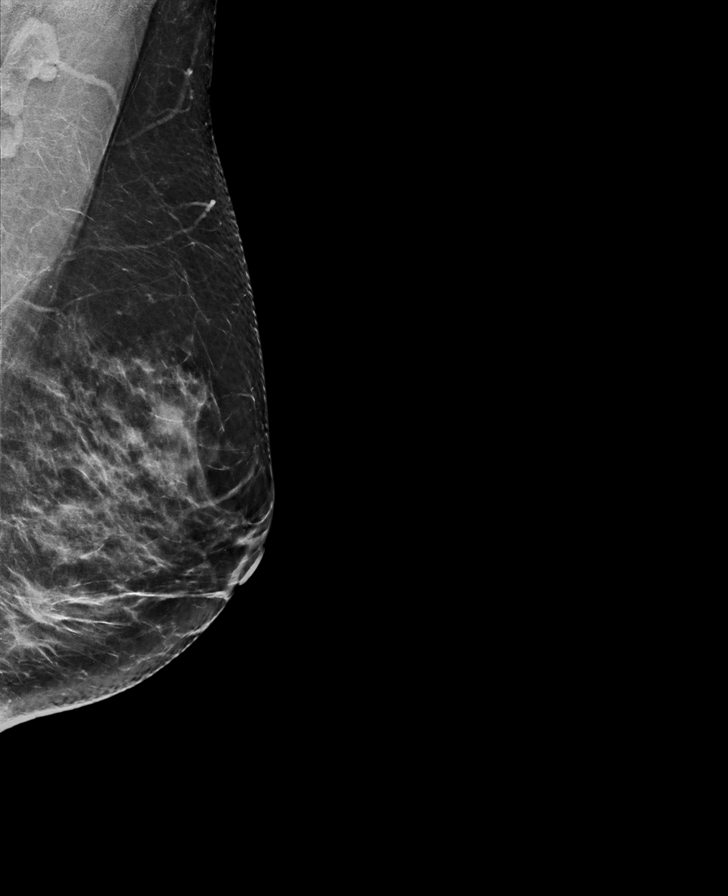

[R CC synth-2D]
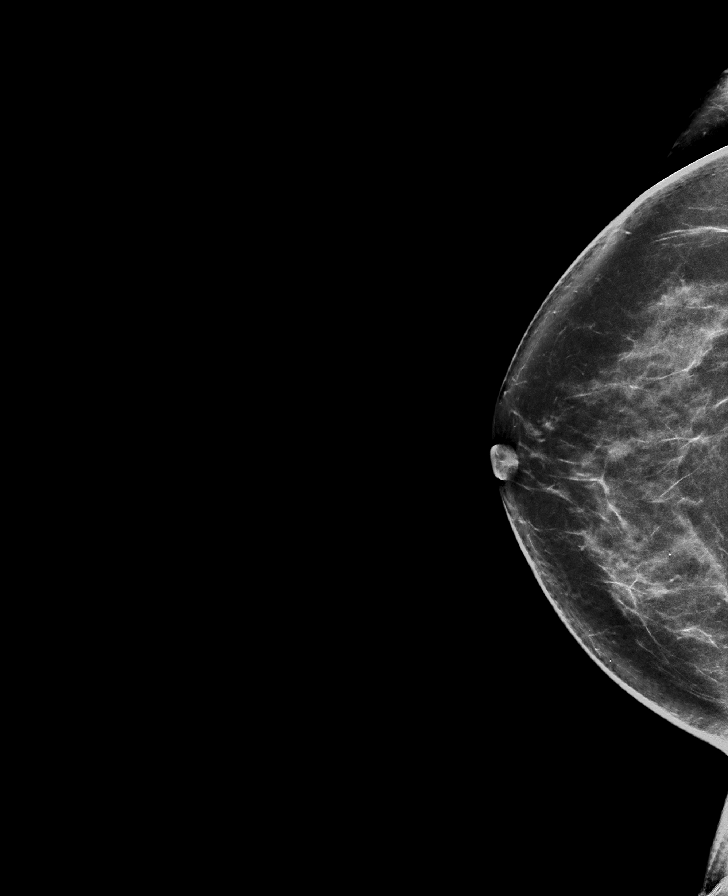

[R MLO tomo · tomo slice 39/76.0]
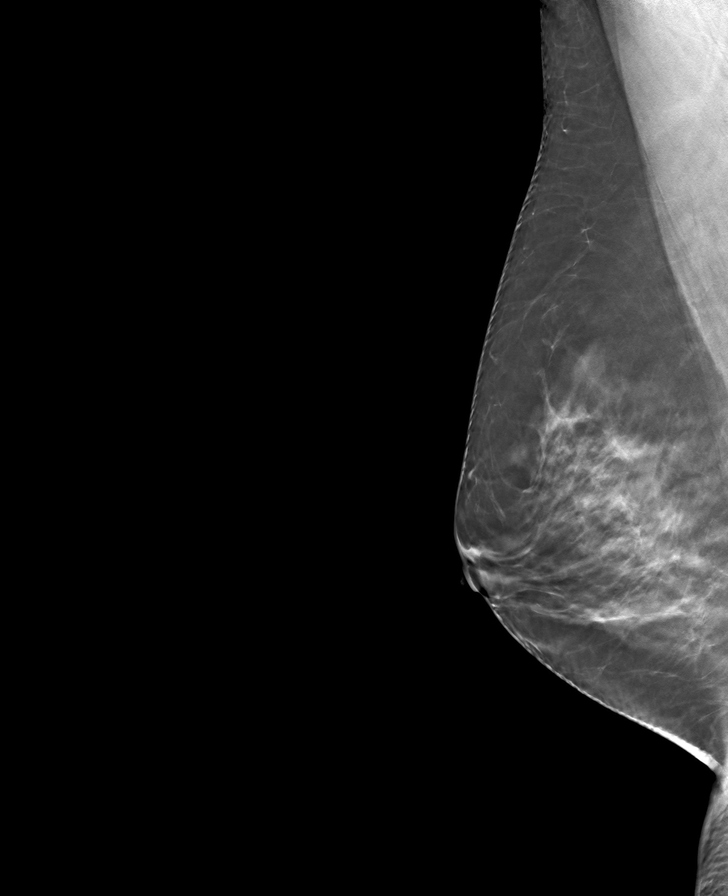

[L MLO tomo · tomo slice 39/78.0]
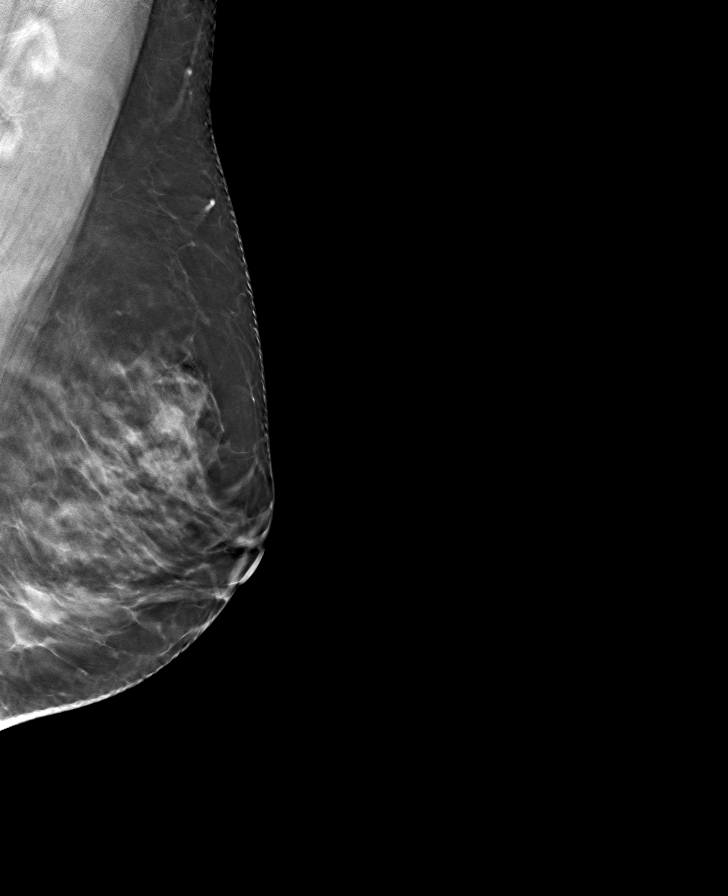

[R CC tomo · tomo slice 44/87.0]
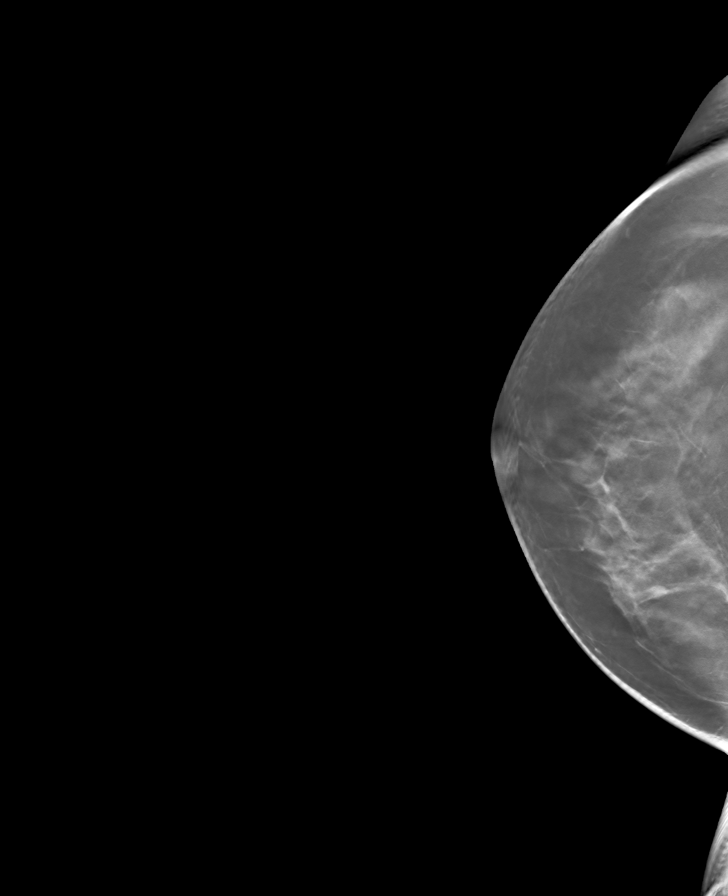

[L CC tomo · tomo slice 41/80.0]
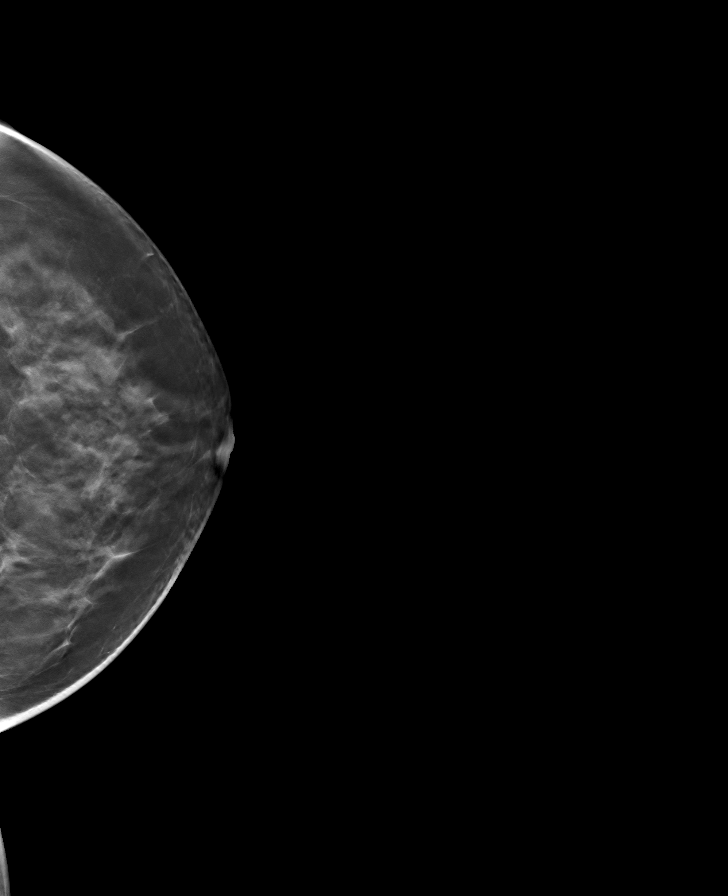

[8 of 24 positions shown; findings below may reference images not displayed]

ACR Breast Density Category c: The breast tissue is heterogeneously
dense, which may obscure small masses.
FINDINGS: There are no findings suspicious for malignancy.
IMPRESSION: No mammographic evidence of malignancy. A result letter of this
screening mammogram will be mailed directly to the patient.

RECOMMENDATION:
Screening mammogram in one year. (Code:[V2])

BI-RADS CATEGORY  1: Negative.

## 2021-07-13 ENCOUNTER — Other Ambulatory Visit: Payer: Self-pay

## 2021-07-13 ENCOUNTER — Ambulatory Visit
Admission: EM | Admit: 2021-07-13 | Discharge: 2021-07-13 | Disposition: A | Discharge status: Home / Self Care | Payer: BC Managed Care – PPO | Attending: Medical Oncology | Admitting: Medical Oncology

## 2021-07-13 DIAGNOSIS — R110 Nausea: Secondary | ICD-10-CM | POA: Diagnosis not present

## 2021-07-13 DIAGNOSIS — G43111 Migraine with aura, intractable, with status migrainosus: Secondary | ICD-10-CM | POA: Diagnosis not present

## 2021-07-13 MED ORDER — METHYLPREDNISOLONE SODIUM SUCC 125 MG IJ SOLR
125.0000 mg | Freq: Once | INTRAMUSCULAR | Status: AC
Start: 2021-07-13 — End: 2021-07-13
  Administered 2021-07-13: 125 mg via INTRAMUSCULAR

## 2021-07-13 MED ORDER — KETOROLAC TROMETHAMINE 60 MG/2ML IM SOLN
60.0000 mg | Freq: Once | INTRAMUSCULAR | Status: AC
Start: 2021-07-13 — End: 2021-07-13
  Administered 2021-07-13: 60 mg via INTRAMUSCULAR

## 2021-07-13 MED ORDER — PROMETHAZINE HCL 25 MG/ML IJ SOLN
25.0000 mg | Freq: Once | INTRAMUSCULAR | Status: AC
  Administered 2021-07-13: 25 mg via INTRAMUSCULAR

## 2021-07-13 NOTE — ED Triage Notes (Addendum)
Patient presents to Urgent Care with complaints of dizziness, nausea, and sensitivity to light since today and intermittent headache  x 3 days. Treating symptoms with tylenol. Pt states she has a hx of chronic headaches.

## 2021-07-13 NOTE — ED Provider Notes (Signed)
[facility]    CSN: [id number] Arrival date & time: 07/13/21  0912      History   Chief Complaint Chief Complaint  Patient presents with   Dizziness    HPI Joanna Keith is a 55 y.o. female.   HPI  Headache: Patient has a history of chronic migraines.  She states that she started having one of her normal chronic migraines 3 days ago.  Normally she takes ibuprofen and this significantly helps her headaches however she has been having some gastritis recently and wanted to avoid NSAIDs to avoid GI upset.  She has been using Tylenol but it has not been very effective.  Traditionally this is not as effective as ibuprofen for her.  She states that she continues to have pain around bilateral temples which is normal for her with migraines along with nausea, light sensitivity and 7 out of 10 headache.  She denies any fevers, neck stiffness, vomiting, head injury or significant visual changes.  She does report that as of this morning she had some positional vertigo when she got up and out of bed and that this lasted for few minutes.  This has resolved.  Of note she was driven here by her husband  Past Medical History:  Diagnosis Date   Asthma     There are no problems to display for this patient.   Past Surgical History:  Procedure Laterality Date   CESAREAN SECTION     ESOPHAGOGASTRODUODENOSCOPY (EGD) WITH PROPOFOL N/A 05/14/2020   Procedure: ESOPHAGOGASTRODUODENOSCOPY (EGD) WITH PROPOFOL;  Surgeon: Lesly Rubenstein, MD;  Location: [facility] ENDOSCOPY;  Service: Endoscopy;  Laterality: N/A;   EYE SURGERY     HAMMER TOE SURGERY Right 02/25/2019   Procedure: HAMMER TOE CORRECTION;  Surgeon: Sharlotte Alamo, DPM;  Location: [facility] ORS;  Service: Podiatry;  Laterality: Right;   HEMORROIDECTOMY      OB History   No obstetric history on file.      Home Medications    Prior to Admission medications   Medication Sig Start Date End Date Taking? Authorizing Provider  albuterol  (VENTOLIN HFA) 108 (90 Base) MCG/ACT inhaler Inhale 2 puffs into the lungs every 6 (six) hours as needed for wheezing or shortness of breath.    [provider]  pantoprazole (PROTONIX) 40 MG tablet Take 40 mg by mouth daily. Patient not taking: Reported on 05/14/2020    [provider]  Polyvinyl Alcohol-Povidone PF (REFRESH) 1.4-0.6 % SOLN Place 1 drop into both eyes daily as needed (for dry eyes).    [provider]  predniSONE (DELTASONE) 50 MG tablet 1 tablet daily x 5 days Patient not taking: Reported on 05/14/2020 03/28/20   Coral Spikes, DO  QVAR REDIHALER 40 MCG/ACT inhaler Inhale 1 puff into the lungs 2 (two) times daily. 03/18/20   [provider]  montelukast (SINGULAIR) 10 MG tablet Take 10 mg by mouth at bedtime as needed.  03/28/20  [provider]    Family History Family History  Problem Relation Age of Onset   Healthy Mother    Alzheimer's disease Father    Breast cancer Neg Hx     Social History Social History   Tobacco Use   Smoking status: Never   Smokeless tobacco: Never  Vaping Use   Vaping Use: Never used  Substance Use Topics   Alcohol use: No   Drug use: No     Allergies   Pollen extract   Review of Systems Review of  Systems  As stated above in HPI Physical Exam Triage Vital Signs ED Triage Vitals  Enc Vitals Group     BP 07/13/21 1033 132/85     Pulse Rate 07/13/21 1033 66     Resp 07/13/21 1033 16     Temp 07/13/21 1033 98.5 F (36.9 C)     Temp Source 07/13/21 1033 Oral     SpO2 07/13/21 1033 97 %     Weight --      Height --      Head Circumference --      Peak Flow --      Pain Score 07/13/21 1026 7     Pain Loc --      Pain Edu? --      Excl. in [place]? --    No data found.  Updated Vital Signs BP 132/85 (BP Location: Left Arm)   Pulse 66   Temp 98.5 F (36.9 C) (Oral)   Resp 16   LMP 09/06/2015 Comment: deneis preg, signed preg waiver; TUBAL LIGATION  SpO2 97%   Physical  Exam Vitals and nursing note reviewed.  Constitutional:      General: She is not in acute distress.    Appearance: Normal appearance. She is not ill-appearing, toxic-appearing or diaphoretic.     Comments: Wearing dark sunglasses  HENT:     Head: Normocephalic and atraumatic.     Right Ear: Tympanic membrane normal.     Left Ear: Tympanic membrane normal.     Nose: Nose normal.     Mouth/Throat:     Mouth: Mucous membranes are moist.  Eyes:     General: No visual field deficit.    Extraocular Movements: Extraocular movements intact.     Pupils: Pupils are equal, round, and reactive to light.  Cardiovascular:     Rate and Rhythm: Normal rate and regular rhythm.     Heart sounds: Normal heart sounds.  Pulmonary:     Effort: Pulmonary effort is normal.     Breath sounds: Normal breath sounds.  Musculoskeletal:     Cervical back: Normal range of motion and neck supple.  Skin:    General: Skin is warm.  Neurological:     General: No focal deficit present.     Mental Status: She is alert and oriented to person, place, and time. Mental status is at baseline.     Cranial Nerves: No cranial nerve deficit or facial asymmetry.     Sensory: Sensation is intact.     Motor: Motor function is intact.     Coordination: Coordination is intact.     Deep Tendon Reflexes:     Reflex Scores:      Patellar reflexes are 2+ on the right side and 2+ on the left side.    UC Treatments / Results  Labs (all labs ordered are listed, but only abnormal results are displayed) Labs Reviewed - No data to display  EKG   Radiology No results found.  Procedures Procedures (including critical care time)  Medications Ordered in UC Medications - No data to display  Initial Impression / Assessment and Plan / UC Course  I have reviewed the triage vital signs and the nursing notes.  Pertinent labs & imaging results that were available during my care of the patient were reviewed by me and considered  in my medical decision making (see chart for details).     New. Acute on chronic migraine. Dizziness is new but has resolved and  she has a normal neurological examination in office today. Will treat with IM medications here with long discussion of red flag signs and symptoms that would indicate that she needs to go to the ER. Follow up PRN. Rest, hydration with water and high protein meals.  Final Clinical Impressions(s) / UC Diagnoses   Final diagnoses:  None   Discharge Instructions   None    ED Prescriptions   None    PDMP not reviewed this encounter.   Hughie Closs, [place] 07/13/21 1206

## 2021-09-02 ENCOUNTER — Other Ambulatory Visit: Payer: Self-pay | Admitting: Physician Assistant

## 2021-09-02 DIAGNOSIS — G4440 Drug-induced headache, not elsewhere classified, not intractable: Secondary | ICD-10-CM

## 2021-09-02 DIAGNOSIS — R2689 Other abnormalities of gait and mobility: Secondary | ICD-10-CM

## 2021-09-02 DIAGNOSIS — H538 Other visual disturbances: Secondary | ICD-10-CM

## 2021-09-02 DIAGNOSIS — R42 Dizziness and giddiness: Secondary | ICD-10-CM

## 2021-09-02 DIAGNOSIS — R519 Headache, unspecified: Secondary | ICD-10-CM

## 2021-09-30 ENCOUNTER — Ambulatory Visit
Admission: RE | Admit: 2021-09-30 | Discharge: 2021-09-30 | Disposition: A | Discharge status: Home / Self Care | Payer: BC Managed Care – PPO | Source: Ambulatory Visit | Attending: Physician Assistant | Admitting: Physician Assistant

## 2021-09-30 DIAGNOSIS — R2689 Other abnormalities of gait and mobility: Secondary | ICD-10-CM

## 2021-09-30 DIAGNOSIS — H538 Other visual disturbances: Secondary | ICD-10-CM

## 2021-09-30 DIAGNOSIS — G4440 Drug-induced headache, not elsewhere classified, not intractable: Secondary | ICD-10-CM

## 2021-09-30 DIAGNOSIS — R519 Headache, unspecified: Secondary | ICD-10-CM

## 2021-09-30 DIAGNOSIS — R42 Dizziness and giddiness: Secondary | ICD-10-CM

## 2021-09-30 IMAGING — MR MR HEAD W/O CM
11 series · 48 of 48 positions shown · non-contrast
Comparison: MRI head [DATE].

CLINICAL DATA: Nonintractable headache, unspecified chronicity
pattern, unspecified headache type [40] ([40]-CM)Dizziness R42
([40]-CM)Rebound headache [40] ([40]-CM)Imbalance [40]
([40]-CM)Blurred vision [40] ([40]-CM)

EXAM:
MRI HEAD WITHOUT CONTRAST
TECHNIQUE: Multiplanar, multiecho pulse sequences of the brain and surrounding
structures were obtained without intravenous contrast.

[Series 5: T1 · sagittal · 4.0mm · 0.75mm/px · 2 of 31 slices shown (1 of 2)]
[im 1/31]
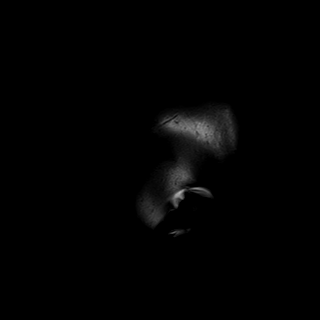
[im 31/31]
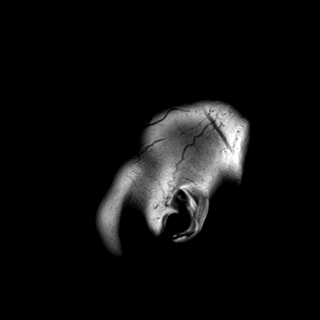

[Series 6: DWI · axial · 3.0mm · 0.94mm/px · z∈[-97,+61]mm · 11 of 180 slices shown (1 of 3)]
[im 1/180]
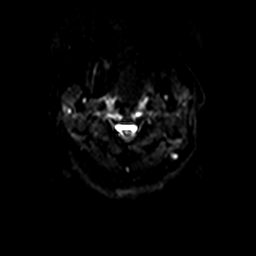
[im 18/180]
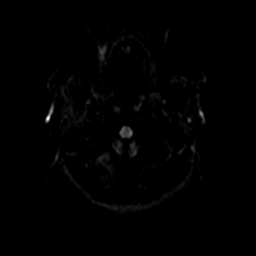
[im 36/180]
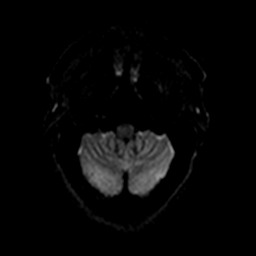
[im 54/180]
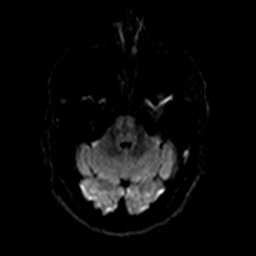
[im 72/180]
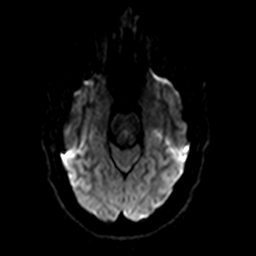
[im 90/180]
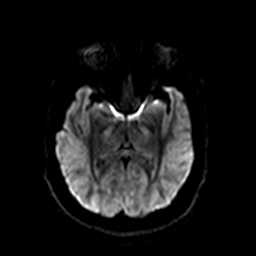
[im 108/180]
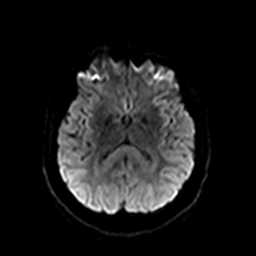
[im 126/180]
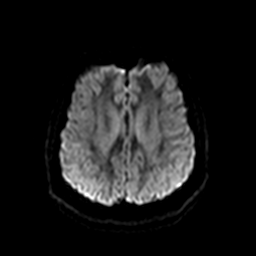
[im 144/180]
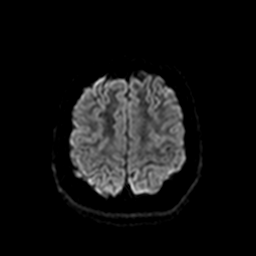
[im 162/180]
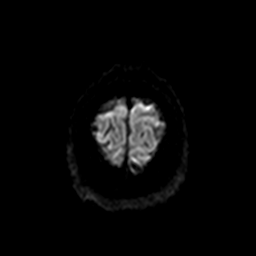
[im 180/180]
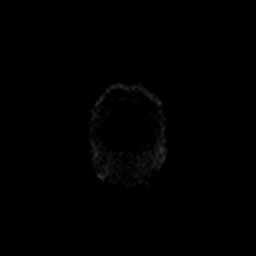

[Series 7: ax dwi_tracew · axial · 3.0mm · 0.94mm/px · z∈[-97,+61]mm · 6 of 90 slices shown]
[im 1/90]
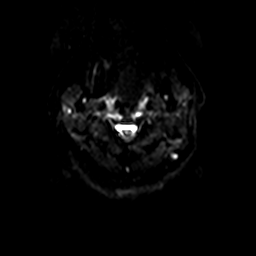
[im 18/90]
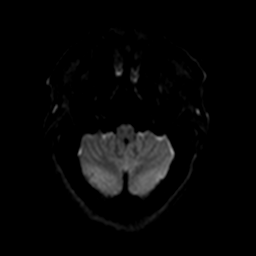
[im 36/90]
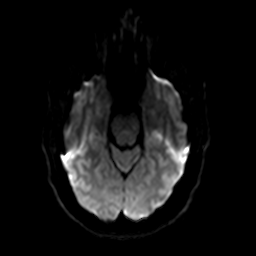
[im 54/90]
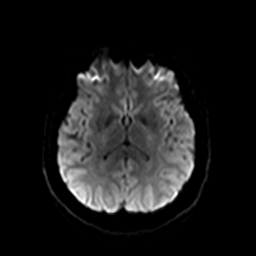
[im 72/90]
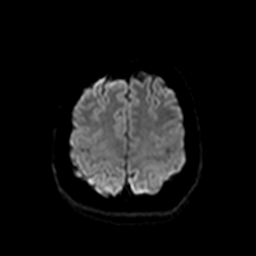
[im 90/90]
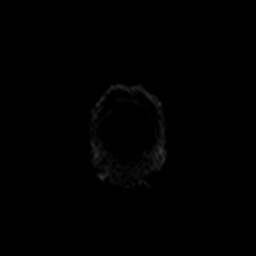

[Series 8: ax dwi_adc · axial · 3.0mm · 0.94mm/px · z∈[-97,+61]mm · 3 of 45 slices shown]
[im 1/45]
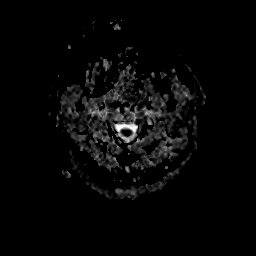
[im 23/45]
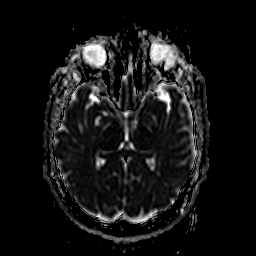
[im 45/45]
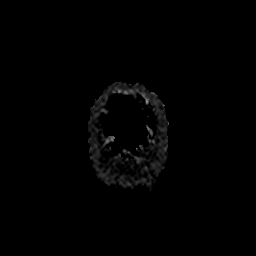

[Series 9: DWI · coronal · 5.0mm · 1.44mm/px · 4 of 66 slices shown (2 of 3)]
[im 1/66]
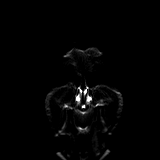
[im 22/66]
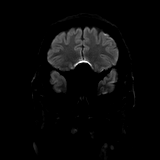
[im 44/66]
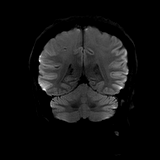
[im 66/66]
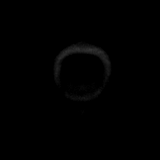

[Series 10: DWI · coronal · 5.0mm · 1.44mm/px · 2 of 33 slices shown (3 of 3)]
[im 1/33]
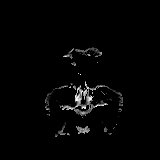
[im 33/33]
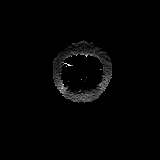

[Series 11: T2 · axial · 4.0mm · 0.36mm/px · z∈[-92,+69]mm · 2 of 32 slices shown (1 of 2)]
[im 1/32]
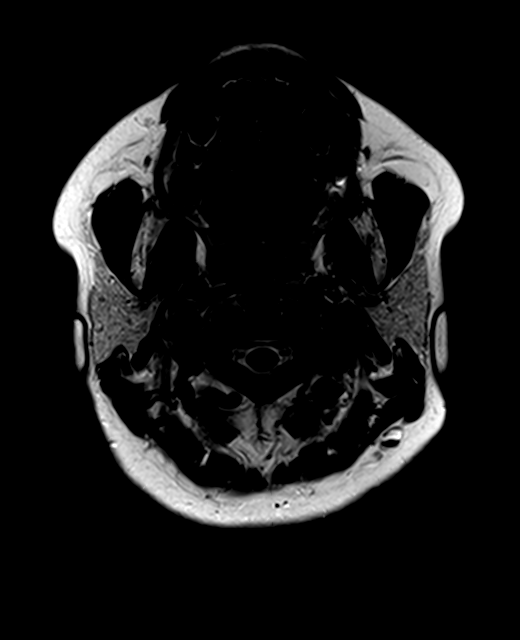
[im 32/32]
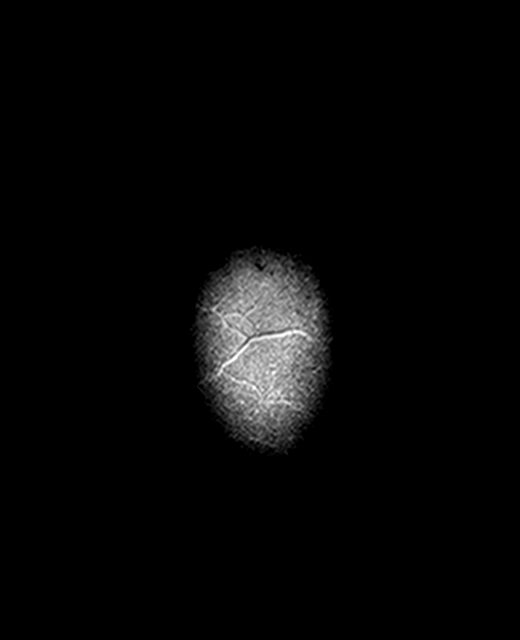

[Series 14: swi_images · axial · 3.0mm · 0.90mm/px · z∈[-106,+83]mm · 4 of 64 slices shown]
[im 1/64]
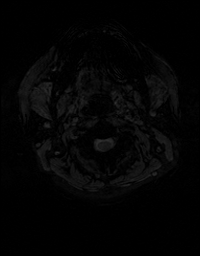
[im 22/64]
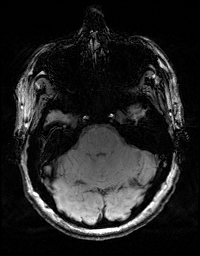
[im 43/64]
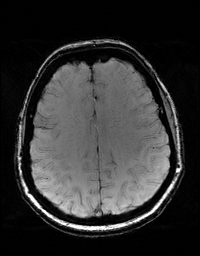
[im 64/64]
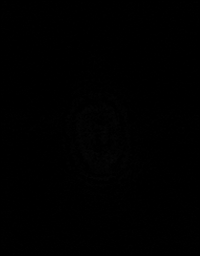

[Series 15: FLAIR · axial · 3.0mm · 0.72mm/px · z∈[-86,+64]mm · 2 of 26 slices shown]
[im 1/26]
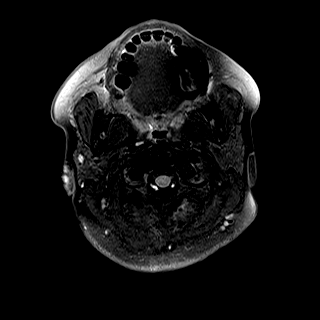
[im 26/26]
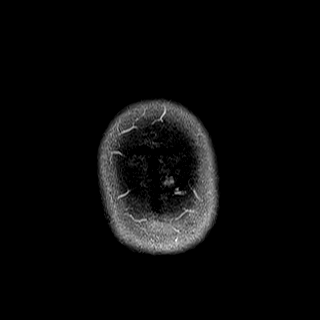

[Series 16: T1 · axial · 1.0mm · 0.90mm/px · z∈[-101,+57]mm · 10 of 160 slices shown (2 of 2)]
[im 1/160]
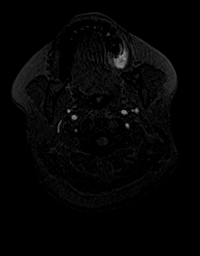
[im 18/160]
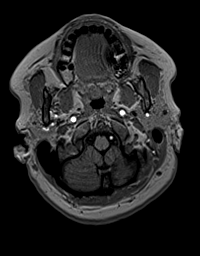
[im 36/160]
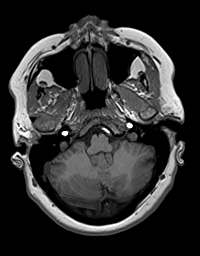
[im 54/160]
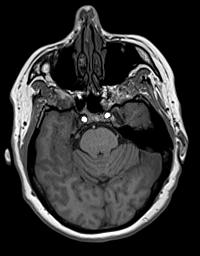
[im 71/160]
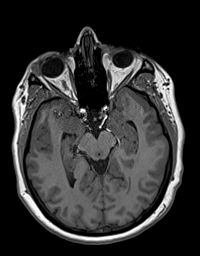
[im 89/160]
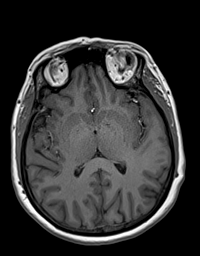
[im 107/160]
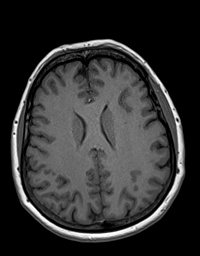
[im 124/160]
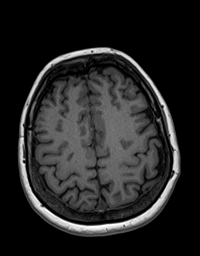
[im 142/160]
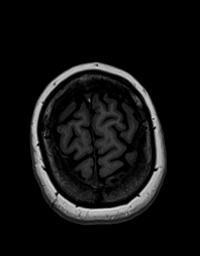
[im 160/160]
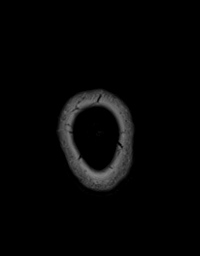

[Series 17: T2 · coronal · 4.5mm · 0.36mm/px · 2 of 31 slices shown (2 of 2)]
[im 1/31]
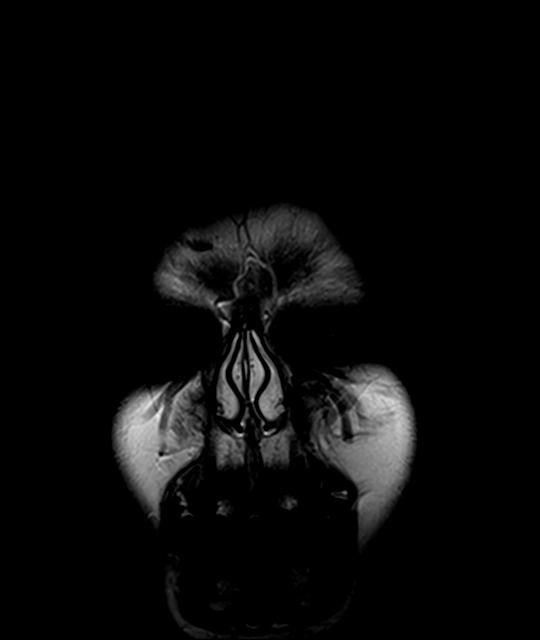
[im 31/31]
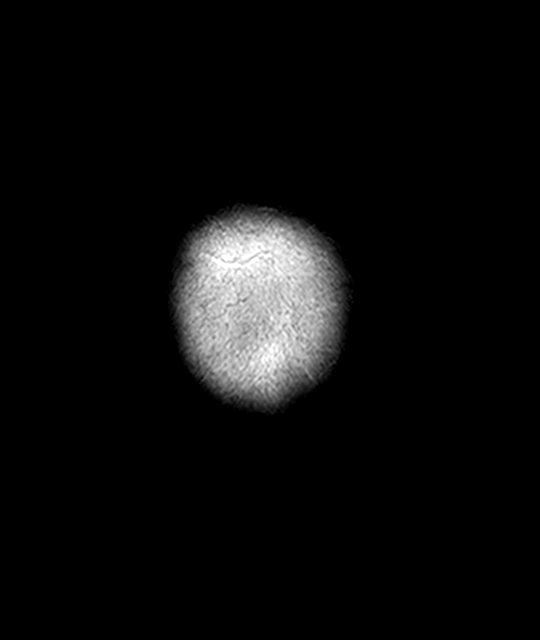

[48 of 48 positions shown; findings below may reference images not displayed]

FINDINGS: Brain: No acute infarction, acute acute hemorrhage, hydrocephalus,
extra-axial collection or mass lesion. No substantial change in
multiple (approximately 5) scattered small foci of susceptibility
artifact within bilateral cerebral hemispheres, compatible with
chronic microhemorrhages. Mild small scattered T2/FLAIR
hyperintensities in the white matter, which are nonspecific but
considered within normal limits for patient age.

Vascular: Major arterial flow voids are maintained at the skull
base.

Skull and upper cervical spine: Normal marrow signal.

Sinuses/Orbits: Clear sinuses.  Unremarkable orbits.

Other: Trace left mastoid effusion.
IMPRESSION: 1. No evidence of acute intracranial abnormality.
2. Similar chronic microhemorrhages, which are in a nonspecific
distribution.

## 2021-12-10 ENCOUNTER — Ambulatory Visit
Admission: EM | Admit: 2021-12-10 | Discharge: 2021-12-10 | Disposition: A | Discharge status: Home / Self Care | Payer: BC Managed Care – PPO | Attending: Student | Admitting: Student

## 2021-12-10 ENCOUNTER — Other Ambulatory Visit: Payer: Self-pay

## 2021-12-10 DIAGNOSIS — J4521 Mild intermittent asthma with (acute) exacerbation: Secondary | ICD-10-CM | POA: Diagnosis present

## 2021-12-10 DIAGNOSIS — Z1152 Encounter for screening for COVID-19: Secondary | ICD-10-CM | POA: Diagnosis present

## 2021-12-10 DIAGNOSIS — J029 Acute pharyngitis, unspecified: Secondary | ICD-10-CM | POA: Diagnosis present

## 2021-12-10 LAB — RESP PANEL BY RT-PCR (FLU A&B, COVID) ARPGX2
Influenza A by PCR: NEGATIVE
Influenza B by PCR: NEGATIVE
SARS Coronavirus 2 by RT PCR: NEGATIVE

## 2021-12-10 LAB — GROUP A STREP BY PCR: Group A Strep by PCR: NOT DETECTED

## 2021-12-10 MED ORDER — ALBUTEROL SULFATE HFA 108 (90 BASE) MCG/ACT IN AERS: 1.0000 | INHALATION_SPRAY | Freq: Four times a day (QID) | RESPIRATORY_TRACT | 0 refills | Status: AC | PRN

## 2021-12-10 MED ORDER — PREDNISONE 10 MG PO TABS: 20.0000 mg | ORAL_TABLET | Freq: Every day | ORAL | 0 refills | Status: AC

## 2021-12-10 MED ORDER — PROMETHAZINE-DM 6.25-15 MG/5ML PO SYRP: 5.0000 mL | ORAL_SOLUTION | Freq: Four times a day (QID) | ORAL | 0 refills | Status: DC | PRN

## 2021-12-10 NOTE — ED Triage Notes (Signed)
Patient is here for "sore throat" & "ha". Started on Wednesday. Cough at times. No fever (known). Chills at times. COVID19 test "Negative" at home. "Sorethroat is bad this morning".  ?

## 2021-12-10 NOTE — Discharge Instructions (Addendum)
-  Prednisone, 1 pill taken at the same time for 5 days in a row.  Try taking this earlier in the day as it can give you energy. Avoid NSAIDs like ibuprofen and alleve while taking this medication as they can increase your risk of stomach upset and even GI bleeding when in combination with a steroid. You can continue tylenol (acetaminophen) up to '1000mg'$  3x daily. ?-Albuterol inhaler as needed for cough, wheezing, shortness of breath, 1 to 2 puffs every 6 hours as needed. ?-Promethazine DM cough syrup for congestion/cough. This could make you drowsy, so take at night before bed. ?-With a virus, you're typically contagious for 5-7 days, or as long as you're having fevers.  ?-Continue dayquil or similar for additional relief ? ?

## 2021-12-10 NOTE — ED Provider Notes (Signed)
?[place] ? ? ? ?CSN: [id number] ?Arrival date & time: 12/10/21  2725 ? ? ?  ? ?History   ?Chief Complaint ?Chief Complaint  ?Patient presents with  ? Sore Throat  ?  With [place]win.  ? ? ?HPI ?Joanna Keith is a 56 y.o. female presenting with sore throat, headache, cough.  History of asthma that is currently not controlled on any medications.  She describes nonproductive cough, sore throat, malaise, subjective chills, myalgias.  Negative home COVID test, states that this feels like COVID.  Has attempted DayQuil with some relief.  Denies shortness of breath, chest pain, dizziness, weakness, nausea, vomiting. ? ?HPI ? ?Past Medical History:  ?Diagnosis Date  ? Asthma   ? ? ?Patient Active Problem List  ? Diagnosis Date Noted  ? Age-related nuclear cataract of right eye 10/07/2019  ? Pseudophakia of left eye 10/07/2019  ? Chronic angle-closure glaucoma of both eyes, mild stage 08/14/2019  ? Mild intermittent asthma, uncomplicated 36/64/4034  ? Rotator cuff tendinitis, right 09/27/2017  ? Chronic allergic rhinitis 08/29/2017  ? Lumbar spondylosis 03/01/2017  ? Low back pain 02/15/2017  ? 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 Hemorrhoids 03/27/2014  ? ? ?Past Surgical History:  ?Procedure Laterality Date  ? CESAREAN SECTION    ? ESOPHAGOGASTRODUODENOSCOPY (EGD) WITH PROPOFOL N/A 05/14/2020  ? Procedure: ESOPHAGOGASTRODUODENOSCOPY (EGD) WITH PROPOFOL;  Surgeon: Lesly Rubenstein, MD;  Location: [facility];  Service: Endoscopy;  Laterality: N/A;  ? EYE SURGERY    ? HAMMER TOE SURGERY Right 02/25/2019  ? Procedure: HAMMER TOE CORRECTION;  Surgeon: Sharlotte Alamo, DPM;  Location: [facility];  Service: Podiatry;  Laterality: Right;  ? HEMORROIDECTOMY    ? ? ?OB History   ?No obstetric history on file. ?  ? ? ? ?Home Medications   ? ?Prior to Admission medications   ?Medication Sig Start Date End Date Taking? Authorizing Provider  ?albuterol (VENTOLIN HFA) 108 (90 Base) MCG/ACT inhaler Inhale 1-2 puffs into the lungs every 6 (six)  hours as needed for wheezing or shortness of breath. 12/10/21  Yes Hazel Sams, PA-C  ?predniSONE (DELTASONE) 10 MG tablet Take 2 tablets (20 mg total) by mouth daily for 5 days. 12/10/21 12/15/21 Yes Hazel Sams, PA-C  ?promethazine-dextromethorphan (PROMETHAZINE-DM) 6.25-15 MG/5ML syrup Take 5 mLs by mouth 4 (four) times daily as needed for cough. 12/10/21  Yes Hazel Sams, PA-C  ?Budeson-Glycopyrrol-Formoterol (BREZTRI AEROSPHERE) 160-9-4.8 MCG/ACT AERO Inhale into the lungs. 04/20/20   [provider]  ?budesonide (PULMICORT) 0.5 MG/2ML nebulizer solution Inhale into the lungs. 03/23/20   [provider]  ?butalbital-acetaminophen-caffeine (FIORICET) 50-325-40 MG tablet Take 1 tablet by mouth 2 (two) times daily as needed. 08/18/21   [provider]  ?clotrimazole-betamethasone (LOTRISONE) cream Apply topically 2 (two) times daily. 10/19/21   [provider]  ?Docusate Sodium (DSS) 100 MG CAPS Take by mouth. 08/25/20   [provider]  ?estradiol (ESTRACE) 0.1 MG/GM vaginal cream Place vaginally. 03/10/21 03/10/22  [provider]  ?ketorolac (TORADOL) 10 MG tablet Take 10 mg by mouth every 6 (six) hours as needed. 10/19/21   [provider]  ?meclizine (ANTIVERT) 12.5 MG tablet Take 25 mg by mouth 3 (three) times daily as needed. 07/29/21   [provider]  ?montelukast (SINGULAIR) 10 MG tablet Take by mouth. 05/05/20   [provider]  ?nortriptyline (PAMELOR) 10 MG capsule Take by mouth. 08/16/21   [provider]  ?pantoprazole (PROTONIX) 40 MG tablet Take 40 mg  by mouth daily. ?Patient not taking: Reported on 05/14/2020    [provider]  ?Polyvinyl Alcohol-Povidone PF (REFRESH) 1.4-0.6 % SOLN Place 1 drop into both eyes daily as needed (for dry eyes).    [provider]  ?prednisoLONE acetate (PRED FORTE) 1 % ophthalmic suspension One drop 4x daily x 1 week, 3x daily x 1 week, 2x daily x 1 week, 1x daily x  1 week then stop, left eye 06/29/20   [provider]  ?venlafaxine XR (EFFEXOR-XR) 37.5 MG 24 hr capsule Take by mouth. 07/18/21 07/18/22  [provider]  ? ? ?Family History ?Family History  ?Problem Relation Age of Onset  ? Healthy Mother   ? Alzheimer's disease Father   ? Breast cancer Neg Hx   ? ? ?Social History ?Social History  ? ?Tobacco Use  ? Smoking status: Never  ? Smokeless tobacco: Never  ?Vaping Use  ? Vaping Use: Never used  ?Substance Use Topics  ? Alcohol use: No  ? Drug use: No  ? ? ? ?Allergies   ?Pollen extract ? ? ?Review of Systems ?Review of Systems  ?Constitutional:  Negative for appetite change, chills and fever.  ?HENT:  Positive for congestion and sore throat. Negative for ear pain, rhinorrhea, sinus pressure and sinus pain.   ?Eyes:  Negative for redness and visual disturbance.  ?Respiratory:  Positive for cough. Negative for chest tightness, shortness of breath and wheezing.   ?Cardiovascular:  Negative for chest pain and palpitations.  ?Gastrointestinal:  Negative for abdominal pain, constipation, diarrhea, nausea and vomiting.  ?Genitourinary:  Negative for dysuria, frequency and urgency.  ?Musculoskeletal:  Negative for myalgias.  ?Neurological:  Negative for dizziness, weakness and headaches.  ?Psychiatric/Behavioral:  Negative for confusion.   ?All other systems reviewed and are negative. ? ? ?Physical Exam ?Triage Vital Signs ?ED Triage Vitals  ?Enc Vitals Group  ?   BP 12/10/21 0842 120/86  ?   Pulse Rate 12/10/21 0842 92  ?   Resp 12/10/21 0842 18  ?   Temp 12/10/21 0842 99.2 ?F (37.3 ?C)  ?   Temp Source 12/10/21 0842 Oral  ?   SpO2 12/10/21 0842 98 %  ?   Weight 12/10/21 0840 160 lb (72.6 kg)  ?   Height 12/10/21 0840 '5\' 2"'$  (1.575 m)  ?   Head Circumference --   ?   Peak Flow --   ?   Pain Score 12/10/21 0837 8  ?   Pain Loc --   ?   Pain Edu? --   ?   Excl. in [place]? --   ? ?No data found. ? ?Updated Vital Signs ?BP 120/86 (BP Location: Left Arm)   Pulse 92    Temp 99.2 ?F (37.3 ?C) (Oral)   Resp 18   Ht '5\' 2"'$  (1.575 m)   Wt 160 lb (72.6 kg)   LMP 09/06/2015 Comment: deneis preg, signed preg waiver; TUBAL LIGATION  SpO2 98%   BMI 29.26 kg/m?  ? ?Visual Acuity ?Right Eye Distance:   ?Left Eye Distance:   ?Bilateral Distance:   ? ?Right Eye Near:   ?Left Eye Near:    ?Bilateral Near:    ? ?Physical Exam ?Vitals reviewed.  ?Constitutional:   ?   General: She is not in acute distress. ?   Appearance: Normal appearance. She is not ill-appearing.  ?HENT:  ?   Head: Normocephalic and atraumatic.  ?   Right Ear: Tympanic membrane, ear canal and external  ear normal. No tenderness. No middle ear effusion. There is no impacted cerumen. Tympanic membrane is not perforated, erythematous, retracted or bulging.  ?   Left Ear: Tympanic membrane, ear canal and external ear normal. No tenderness.  No middle ear effusion. There is no impacted cerumen. Tympanic membrane is not perforated, erythematous, retracted or bulging.  ?   Nose: Nose normal. No congestion.  ?   Mouth/Throat:  ?   Mouth: Mucous membranes are moist.  ?   Pharynx: Uvula midline. No oropharyngeal exudate or posterior oropharyngeal erythema.  ?   Comments: Tonsils are small with minimal erythema. On exam, uvula is midline, she is tolerating her secretions without difficulty, there is no trismus, no drooling, she has normal phonation ? ?Eyes:  ?   Extraocular Movements: Extraocular movements intact.  ?   Pupils: Pupils are equal, round, and reactive to light.  ?Cardiovascular:  ?   Rate and Rhythm: Normal rate and regular rhythm.  ?   Heart sounds: Normal heart sounds.  ?Pulmonary:  ?   Effort: Pulmonary effort is normal.  ?   Breath sounds: Normal breath sounds. No decreased breath sounds, wheezing, rhonchi or rales.  ?   Comments: Occ cough  ?Abdominal:  ?   Palpations: Abdomen is soft.  ?   Tenderness: There is no abdominal tenderness. There is no guarding or rebound.  ?Lymphadenopathy:  ?   Cervical: No cervical  adenopathy.  ?   Right cervical: No superficial cervical adenopathy. ?   Left cervical: No superficial cervical adenopathy.  ?Neurological:  ?   General: No focal deficit present.  ?   Mental Status: She is

## 2022-03-27 ENCOUNTER — Ambulatory Visit
Admission: EM | Admit: 2022-03-27 | Discharge: 2022-03-27 | Disposition: A | Discharge status: Home / Self Care | Payer: BC Managed Care – PPO | Attending: Emergency Medicine | Admitting: Emergency Medicine

## 2022-03-27 ENCOUNTER — Ambulatory Visit (INDEPENDENT_AMBULATORY_CARE_PROVIDER_SITE_OTHER): Payer: BC Managed Care – PPO

## 2022-03-27 DIAGNOSIS — M79644 Pain in right finger(s): Secondary | ICD-10-CM

## 2022-03-27 MED ORDER — IBUPROFEN 600 MG PO TABS: 600.0000 mg | ORAL_TABLET | Freq: Four times a day (QID) | ORAL | 0 refills | Status: DC | PRN

## 2022-03-27 NOTE — Discharge Instructions (Addendum)
Your x-rays today showed some signs of degeneration in the middle knuckle of your middle finger.  This may represent some arthritis and be the cause of your pain.  Keep your middle and ring finger buddy tape together to act as a splint and provide some support.  Take the ibuprofen, 610 mg every 6 hours with food, to help with pain and inflammation.  Soak your finger in warm water to help improve blood flow 2-3 times a day.  Put your finger through a range of motion to help maintain mobility.  If your symptoms continue I recommend following up with orthopedics.

## 2022-03-27 NOTE — ED Provider Notes (Signed)
[facility]    CSN: [id number] Arrival date & time: 03/27/22  1832      History   Chief Complaint Chief Complaint  Patient presents with   Finger Injury    Right middle finger    HPI Joanna Keith is a 56 y.o. female.   HPI  56 year old female here for evaluation of right middle finger pain.  Patient reports that she has been experiencing pain on the sides of her right middle finger for the past 2 days.  She is unaware of any injury but states that it does hurt when she tries to close her finger.  She also complaining of numbness in the tip of her finger.  There is no swelling, redness, or bruising.  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      Home Medications    Prior to Admission medications   Medication Sig Start Date End Date Taking? Authorizing Provider  ibuprofen (ADVIL) 600 MG tablet Take 1 tablet (600 mg total) by mouth every 6 (six)  hours as needed. 03/27/22  Yes Margarette Canada, NP  albuterol (VENTOLIN HFA) 108 (90 Base) MCG/ACT inhaler Inhale 1-2 puffs into the lungs every 6 (six) hours as needed for wheezing or shortness of breath. 12/10/21   Hazel Sams, PA-C  Budeson-Glycopyrrol-Formoterol (BREZTRI AEROSPHERE) 160-9-4.8 MCG/ACT AERO Inhale into the lungs. 04/20/20   [provider]  budesonide (PULMICORT) 0.5 MG/2ML nebulizer solution Inhale into the lungs. 03/23/20   [provider]  butalbital-acetaminophen-caffeine (FIORICET) 50-325-40 MG tablet Take 1 tablet by mouth 2 (two) times daily as needed. 08/18/21   [provider]  clotrimazole-betamethasone (LOTRISONE) cream Apply topically 2 (two) times daily. 10/19/21   [provider]  Docusate Sodium (DSS) 100 MG CAPS Take by mouth. 08/25/20   [provider]  meclizine (ANTIVERT) 12.5 MG tablet Take 25 mg by mouth 3 (three) times daily as needed. 07/29/21   [provider]  montelukast (SINGULAIR) 10 MG tablet Take by mouth. 05/05/20   [provider]  nortriptyline (PAMELOR) 10 MG capsule Take by mouth. 08/16/21   [provider]  pantoprazole (PROTONIX) 40 MG tablet Take 40 mg by mouth daily. Patient not taking: Reported on 05/14/2020    [provider]  Polyvinyl Alcohol-Povidone PF (REFRESH) 1.4-0.6 % SOLN Place 1 drop into both eyes daily as needed (for dry eyes).    [provider]  prednisoLONE acetate (PRED FORTE)  1 % ophthalmic suspension One drop 4x daily x 1 week, 3x daily x 1 week, 2x daily x 1 week, 1x daily x 1 week then stop, left eye 06/29/20   [provider]  promethazine-dextromethorphan (PROMETHAZINE-DM) 6.25-15 MG/5ML syrup Take 5 mLs by mouth 4 (four) times daily as needed for cough. 12/10/21   Hazel Sams, PA-C  venlafaxine XR (EFFEXOR-XR) 37.5 MG 24 hr capsule Take by mouth. 07/18/21 07/18/22  [provider]    Family History Family History   Problem Relation Age of Onset   Healthy Mother    Alzheimer's disease Father    Breast cancer Neg Hx     Social History Social History   Tobacco Use   Smoking status: Never   Smokeless tobacco: Never  Vaping Use   Vaping Use: Never used  Substance Use Topics   Alcohol use: No   Drug use: No     Allergies   Pollen extract   Review of Systems Review of Systems  Musculoskeletal:  Positive for arthralgias. Negative for joint swelling.  Skin:  Negative for color change.  Neurological:  Positive for numbness. Negative for weakness.  Hematological: Negative.   Psychiatric/Behavioral: Negative.       Physical Exam Triage Vital Signs ED Triage Vitals  Enc Vitals Group     BP 03/27/22 1951 120/78     Pulse Rate 03/27/22 1951 72     Resp --      Temp 03/27/22 1951 98.2 F (36.8 C)     Temp Source 03/27/22 1951 Oral     SpO2 03/27/22 1951 99 %     Weight 03/27/22 1949 165 lb (74.8 kg)     Height 03/27/22 1949 '5\' 2"'$  (1.575 m)     Head Circumference --      Peak Flow --      Pain Score 03/27/22 1949 8     Pain Loc --      Pain Edu? --      Excl. in [place]? --    No data found.  Updated Vital Signs BP 120/78 (BP Location: Left Arm)   Pulse 72   Temp 98.2 F (36.8 C) (Oral)   Ht '5\' 2"'$  (1.575 m)   Wt 165 lb (74.8 kg)   LMP 09/06/2015 Comment: deneis preg, signed preg waiver; TUBAL LIGATION  SpO2 99%   BMI 30.18 kg/m   Visual Acuity Right Eye Distance:   Left Eye Distance:   Bilateral Distance:    Right Eye Near:   Left Eye Near:    Bilateral Near:     Physical Exam Vitals and nursing note reviewed.  Constitutional:      Appearance: Normal appearance. She is not ill-appearing.  HENT:     Head: Normocephalic and atraumatic.  Musculoskeletal:        General: Tenderness present. No swelling, deformity or signs of injury.  Skin:    General: Skin is warm and dry.     Capillary Refill: Capillary refill takes less than 2 seconds.     Findings: No bruising  or erythema.  Neurological:     General: No focal deficit present.     Mental Status: She is alert and oriented to person, place, and time.  Psychiatric:        Mood and Affect: Mood normal.        Behavior: Behavior normal.        Thought Content: Thought content normal.  Judgment: Judgment normal.      UC Treatments / Results  Labs (all labs ordered are listed, but only abnormal results are displayed) Labs Reviewed - No data to display  EKG   Radiology DG Finger Middle Right  Result Date: 03/27/2022 CLINICAL DATA:  Right middle finger pain EXAM: RIGHT MIDDLE FINGER 2+V COMPARISON:  None Available. FINDINGS: Joint space narrowing and spurring in the DIP joint. No acute bony abnormality. Specifically, no fracture, subluxation, or dislocation. No radiopaque foreign body. IMPRESSION: No acute bony abnormality. Electronically Signed   By: Rolm Baptise M.D.   On: 03/27/2022 20:08    Procedures Procedures (including critical care time)  Medications Ordered in UC Medications - No data to display  Initial Impression / Assessment and Plan / UC Course  I have reviewed the triage vital signs and the nursing notes.  Pertinent labs & imaging results that were available during my care of the patient were reviewed by me and considered in my medical decision making (see chart for details).   Patient is a nontoxic-appearing 56 year old female here for evaluation of pain in her right middle finger that has been going on for the last 2 days and is not in the setting of any known injury.  The patient reports that the pain is mostly on the sides and it does hurt when she tries to close her finger.  She has been keeping it wrapped up to help protect it from pain or further injury.  On exam patient's right middle finger is in normal anatomical alignment.  There is no erythema, edema, or ecchymosis noted.  She states she can feel me touch her pad of her distal phalanx and there is no pain with  compression of her distal phalanx.  The DIP joint does have mild tenderness with palpation on the sides but not on the top and bottom.  There is tenderness in the middle phalanx on the sides as well.  Less so when palpating from above and below.  The PIP joint also has mild tenderness on the sides and less from above or below.  The proximal phalanx and MCP joint are not tender.  Patient's cap refill is less than 2 seconds.  We will obtain radiograph of left finger to evaluate for bony abnormality.  Radiology report of right middle finger x-ray are as follows:  Joint space narrowing and spurring in the DIP joint. No acute bony abnormality. Specifically, no fracture, subluxation, or dislocation. No radiopaque foreign body.  I will treat the patient's pain with buddy taping and ibuprofen.  I will also recommend that she use moist heat or paraffin bath to help improve circulation.  Patient is also perform range of motion exercises once or twice a day to help maintain mobility.   Final Clinical Impressions(s) / UC Diagnoses   Final diagnoses:  Pain of finger of right hand     Discharge Instructions      Your x-rays today showed some signs of degeneration in the middle knuckle of your middle finger.  This may represent some arthritis and be the cause of your pain.  Keep your middle and ring finger buddy tape together to act as a splint and provide some support.  Take the ibuprofen, 610 mg every 6 hours with food, to help with pain and inflammation.  Soak your finger in warm water to help improve blood flow 2-3 times a day.  Put your finger through a range of motion to help maintain mobility.  If  your symptoms continue I recommend following up with orthopedics.     ED Prescriptions     Medication Sig Dispense Auth. Provider   ibuprofen (ADVIL) 600 MG tablet Take 1 tablet (600 mg total) by mouth every 6 (six) hours as needed. 30 tablet Margarette Canada, NP      PDMP not reviewed this  encounter.   Margarette Canada, NP 03/27/22 2015

## 2022-03-27 NOTE — ED Triage Notes (Signed)
Patient reports right middle finger pain -- 2 days ago.

## 2022-05-15 ENCOUNTER — Other Ambulatory Visit: Payer: Self-pay | Admitting: Family Medicine

## 2022-05-15 DIAGNOSIS — Z1231 Encounter for screening mammogram for malignant neoplasm of breast: Secondary | ICD-10-CM

## 2022-06-12 ENCOUNTER — Inpatient Hospital Stay: Admission: RE | Admit: 2022-06-12 | Payer: BC Managed Care – PPO | Source: Ambulatory Visit

## 2023-05-01 ENCOUNTER — Other Ambulatory Visit: Payer: Self-pay | Admitting: Family Medicine

## 2023-05-01 DIAGNOSIS — Z1231 Encounter for screening mammogram for malignant neoplasm of breast: Secondary | ICD-10-CM

## 2023-05-22 ENCOUNTER — Ambulatory Visit
Admission: RE | Admit: 2023-05-22 | Discharge: 2023-05-22 | Disposition: A | Discharge status: Home / Self Care | Payer: BC Managed Care – PPO | Source: Ambulatory Visit | Attending: Family Medicine | Admitting: Family Medicine

## 2023-05-22 DIAGNOSIS — Z1231 Encounter for screening mammogram for malignant neoplasm of breast: Secondary | ICD-10-CM | POA: Diagnosis present

## 2023-08-30 ENCOUNTER — Other Ambulatory Visit: Payer: Self-pay | Admitting: Family Medicine

## 2023-08-30 ENCOUNTER — Ambulatory Visit
Admission: RE | Admit: 2023-08-30 | Discharge: 2023-08-30 | Disposition: A | Discharge status: Home / Self Care | Payer: BC Managed Care – PPO | Source: Ambulatory Visit | Attending: Family Medicine | Admitting: Family Medicine

## 2023-08-30 ENCOUNTER — Ambulatory Visit
Admission: RE | Admit: 2023-08-30 | Discharge: 2023-08-30 | Disposition: A | Discharge status: Home / Self Care | Payer: BC Managed Care – PPO | Attending: Family Medicine | Admitting: Family Medicine

## 2023-08-30 DIAGNOSIS — M5116 Intervertebral disc disorders with radiculopathy, lumbar region: Secondary | ICD-10-CM

## 2024-03-03 ENCOUNTER — Ambulatory Visit
Admission: EM | Admit: 2024-03-03 | Discharge: 2024-03-03 | Disposition: A | Discharge status: Home / Self Care | Attending: Emergency Medicine | Admitting: Emergency Medicine

## 2024-03-03 ENCOUNTER — Ambulatory Visit: Payer: Self-pay | Admitting: Emergency Medicine

## 2024-03-03 DIAGNOSIS — R319 Hematuria, unspecified: Secondary | ICD-10-CM | POA: Diagnosis not present

## 2024-03-03 DIAGNOSIS — N390 Urinary tract infection, site not specified: Secondary | ICD-10-CM | POA: Insufficient documentation

## 2024-03-03 DIAGNOSIS — Z113 Encounter for screening for infections with a predominantly sexual mode of transmission: Secondary | ICD-10-CM | POA: Diagnosis present

## 2024-03-03 DIAGNOSIS — N898 Other specified noninflammatory disorders of vagina: Secondary | ICD-10-CM | POA: Diagnosis not present

## 2024-03-03 LAB — URINALYSIS, W/ REFLEX TO CULTURE (INFECTION SUSPECTED)
Bilirubin Urine: NEGATIVE
Glucose, UA: NEGATIVE mg/dL
Ketones, ur: NEGATIVE mg/dL
Nitrite: POSITIVE — AB
Protein, ur: 30 mg/dL — AB
Specific Gravity, Urine: 1.015 (ref 1.005–1.030)
Squamous Epithelial / HPF: NONE SEEN /HPF (ref 0–5)
WBC, UA: >50 WBC/hpf (ref 0–5)
pH: 7 (ref 5.0–8.0)

## 2024-03-03 MED ORDER — PHENAZOPYRIDINE HCL 200 MG PO TABS: 200.0000 mg | ORAL_TABLET | Freq: Three times a day (TID) | ORAL | 0 refills | Status: AC | PRN

## 2024-03-03 MED ORDER — FLUCONAZOLE 150 MG PO TABS: 150.0000 mg | ORAL_TABLET | Freq: Once | ORAL | 1 refills | Status: AC

## 2024-03-03 MED ORDER — NITROFURANTOIN MONOHYD MACRO 100 MG PO CAPS: 100.0000 mg | ORAL_CAPSULE | Freq: Two times a day (BID) | ORAL | 0 refills | Status: AC

## 2024-03-03 NOTE — ED Provider Notes (Signed)
 HPI  SUBJECTIVE:  Joanna Keith is a 58 y.o. female who presents with dysuria, urinary urgency, frequency, vaginal itching after having intercourse with her husband starting over 2 weeks ago.  Says happens after every time she has intercourse with her husband even though she urinates immediately after intercourse.  States sometimes she holds her urine during the day.  States intercourse is painful due to vaginal dryness.  No cloudy or odorous urine, hematuria, vaginal odor, discharge, rash.  No nausea, vomiting, fevers, abdominal, back, pelvic pain.  She has been in a long-term monogamous relationship with her husband, who is asymptomatic.  STDs are not a concern today, but she is okay with testing for STIs.  She took an unknown antibiotic for 2 days for the flu from her sister, has tried Monistat 7, vagisil and douching without improvement in her symptoms  No alleviating factors.  Symptoms worse with urination.  No antipyretic in the past 6 hours.  She has a past medical history of asthma, BV, yeast vaginitis, UTI.  No history of pyelonephritis, nephrolithiasis, STDs, diabetes urine culture in 05/2022 grew out E. coli resistant to tetracycline, but otherwise sensitive to Augmentin, cephalosporins, Macrobid , Bactrim, fluoroquinolones. PCP: [facility].  Past Medical History:  Diagnosis Date   Asthma     Past Surgical History:  Procedure Laterality Date   CESAREAN SECTION     ESOPHAGOGASTRODUODENOSCOPY (EGD) WITH PROPOFOL  N/A 05/14/2020   Procedure: ESOPHAGOGASTRODUODENOSCOPY (EGD) WITH PROPOFOL ;  Surgeon: Maryruth Ole DASEN, MD;  Location: [facility];  Service: Endoscopy;  Laterality: N/A;   EYE SURGERY     HAMMER TOE SURGERY Right 02/25/2019   Procedure: HAMMER TOE CORRECTION;  Surgeon: Neill Boas, DPM;  Location: [facility];  Service: Podiatry;  Laterality: Right;   HEMORROIDECTOMY      Family History  Problem Relation Age of Onset   Healthy Mother    Alzheimer's disease Father     Breast cancer Neg Hx     Social History   Tobacco Use   Smoking status: Never   Smokeless tobacco: Never  Vaping Use   Vaping status: Never Used  Substance Use Topics   Alcohol use: No   Drug use: No    No current facility-administered medications for this encounter.  Current Outpatient Medications:    fluconazole  (DIFLUCAN ) 150 MG tablet, Take 1 tablet (150 mg total) by mouth once for 1 dose. 1 tab po x 1. May repeat in 72 hours if no improvement, Disp: 2 tablet, Rfl: 1   nitrofurantoin , macrocrystal-monohydrate, (MACROBID ) 100 MG capsule, Take 1 capsule (100 mg total) by mouth 2 (two) times daily for 5 days., Disp: 10 capsule, Rfl: 0   phenazopyridine  (PYRIDIUM ) 200 MG tablet, Take 1 tablet (200 mg total) by mouth 3 (three) times daily as needed for pain., Disp: 6 tablet, Rfl: 0   albuterol  (VENTOLIN  HFA) 108 (90 Base) MCG/ACT inhaler, Inhale 1-2 puffs into the lungs every 6 (six) hours as needed for wheezing or shortness of breath., Disp: 1 each, Rfl: 0   Budeson-Glycopyrrol-Formoterol (BREZTRI AEROSPHERE) 160-9-4.8 MCG/ACT AERO, Inhale into the lungs., Disp: , Rfl:    budesonide (PULMICORT) 0.5 MG/2ML nebulizer solution, Inhale into the lungs., Disp: , Rfl:    butalbital-acetaminophen -caffeine (FIORICET) 50-325-40 MG tablet, Take 1 tablet by mouth 2 (two) times daily as needed., Disp: , Rfl:    clotrimazole-betamethasone (LOTRISONE) cream, Apply topically 2 (two) times daily., Disp: , Rfl:    Docusate Sodium (DSS) 100 MG CAPS, Take by mouth., Disp: ,  Rfl:    meclizine (ANTIVERT) 12.5 MG tablet, Take 25 mg by mouth 3 (three) times daily as needed., Disp: , Rfl:    montelukast (SINGULAIR) 10 MG tablet, Take by mouth., Disp: , Rfl:    nortriptyline (PAMELOR) 10 MG capsule, Take by mouth., Disp: , Rfl:    pantoprazole (PROTONIX) 40 MG tablet, Take 40 mg by mouth daily. (Patient not taking: Reported on 05/14/2020), Disp: , Rfl:    Polyvinyl Alcohol-Povidone PF (REFRESH) 1.4-0.6 %  SOLN, Place 1 drop into both eyes daily as needed (for dry eyes)., Disp: , Rfl:    prednisoLONE  acetate (PRED FORTE ) 1 % ophthalmic suspension, One drop 4x daily x 1 week, 3x daily x 1 week, 2x daily x 1 week, 1x daily x 1 week then stop, left eye, Disp: , Rfl:    venlafaxine XR (EFFEXOR-XR) 37.5 MG 24 hr capsule, Take by mouth., Disp: , Rfl:   Allergies  Allergen Reactions   Pollen Extract Itching     ROS  As noted in HPI.   Physical Exam  BP 133/73 (BP Location: Left Arm)   Pulse 69   Temp 98.3 F (36.8 C) (Oral)   Resp 17   LMP 09/06/2015 Comment: deneis preg, signed preg waiver; TUBAL LIGATION  SpO2 99%   Constitutional: Well developed, well nourished, no acute distress Eyes:  EOMI, conjunctiva normal bilaterally HENT: Normocephalic, atraumatic,mucus membranes moist Respiratory: Normal inspiratory effort Cardiovascular: Normal rate GI: nondistended.  No suprapubic, flank tenderness. Back: No CVAT skin: No rash, skin intact Musculoskeletal: no deformities Neurologic: Alert & oriented x 3, no focal neuro deficits Psychiatric: Speech and behavior appropriate   ED Course   Medications - No data to display  Orders Placed This Encounter  Procedures   Urine Culture    Standing Status:   Standing    Number of Occurrences:   1   Urinalysis, w/ Reflex to Culture (Infection Suspected) -Urine, Clean Catch    Standing Status:   Standing    Number of Occurrences:   1    Specimen Source:   Urine, Clean Catch [[id number]]    Results for orders placed or performed during the hospital encounter of 03/03/24 (from the past 24 hours)  Urinalysis, w/ Reflex to Culture (Infection Suspected) -Urine, Clean Catch     Status: Abnormal   Collection Time: 03/03/24  9:24 AM  Result Value Ref Range   Specimen Source URINE, CLEAN CATCH    Color, Urine YELLOW YELLOW   APPearance CLOUDY (A) CLEAR   Specific Gravity, Urine 1.015 1.005 - 1.030   pH 7.0 5.0 - 8.0   Glucose, UA NEGATIVE NEGATIVE  mg/dL   Hgb urine dipstick SMALL (A) NEGATIVE   Bilirubin Urine NEGATIVE NEGATIVE   Ketones, ur NEGATIVE NEGATIVE mg/dL   Protein, ur 30 (A) NEGATIVE mg/dL   Nitrite POSITIVE (A) NEGATIVE   Leukocytes,Ua LARGE (A) NEGATIVE   Squamous Epithelial / HPF NONE SEEN 0 - 5 /HPF   WBC, UA >50 0 - 5 WBC/hpf   RBC / HPF 6-10 0 - 5 RBC/hpf   Bacteria, UA MANY (A) NONE SEEN   WBC Clumps PRESENT    No results found.  ED Clinical Impression  1. Urinary tract infection with hematuria, site unspecified   2. Vaginal itching   3. Vaginal dryness   4. Screening for STDs (sexually transmitted diseases)      ED Assessment/Plan     Outside labs reviewed.  As noted in HPI.   1.  Urinary symptoms.  Presentation consistent with UTI.  UA consistent with UTI.  Sending off for culture to confirm antibiotic choice.  Home with Macrobid , Pyridium .  Also treating with Diflucan  as I suspect she has a concurrent yeast infection.  2.  Vaginal dryness-suspect from menopause.  Advised personal lubricant and follow-up with PCP or OB/GYN for further management.  Discussed labs,  MDM, treatment plan, and plan for follow-up with patient. Discussed sn/sx that should prompt return to the ED. patient agrees with plan.   Meds ordered this encounter  Medications   fluconazole  (DIFLUCAN ) 150 MG tablet    Sig: Take 1 tablet (150 mg total) by mouth once for 1 dose. 1 tab po x 1. May repeat in 72 hours if no improvement    Dispense:  2 tablet    Refill:  1   nitrofurantoin , macrocrystal-monohydrate, (MACROBID ) 100 MG capsule    Sig: Take 1 capsule (100 mg total) by mouth 2 (two) times daily for 5 days.    Dispense:  10 capsule    Refill:  0   phenazopyridine  (PYRIDIUM ) 200 MG tablet    Sig: Take 1 tablet (200 mg total) by mouth 3 (three) times daily as needed for pain.    Dispense:  6 tablet    Refill:  0      *This clinic note was created using Scientist, clinical (histocompatibility and immunogenetics). Therefore, there may be occasional  mistakes despite careful proofreading.  ?    Van Knee, MD 03/03/24 [phone]

## 2024-03-03 NOTE — Discharge Instructions (Signed)
 Urinalysis is consistent with urinary tract infection.  I am sending off for culture to make sure that we have you on the right antibiotic.  We are also checking for BV, yeast and STDs.  I am sending you home with Macrobid  and Pyridium .  The Macrobid  will treat the infection, the Pyridium  will turn your urine orange, will help your urinary symptoms.  Sending you home with Diflucan .  We will base treatment off of the rest of your labs.  Follow-up with your primary care provider as needed.  Go to the ER for back pain, vomiting, fevers above 100.4, blood in your urine, or for other concerns.  Vaginal dryness, invest in a personal lubricant such as Astro glide which will make intercourse more comfortable for both you and your husband.  Your primary care provider or OB/GYN may be able to prescribe topical estrogen or have other suggestions.

## 2024-03-03 NOTE — ED Triage Notes (Signed)
 Sx x 2 weeks Dysuria  Urinary frequency Vaginal itching  STD testing

## 2024-03-05 LAB — CERVICOVAGINAL ANCILLARY ONLY
Bacterial Vaginitis (gardnerella): NEGATIVE
Candida Glabrata: NEGATIVE
Candida Vaginitis: NEGATIVE
Chlamydia: NEGATIVE
Comment: NEGATIVE
Comment: NEGATIVE
Comment: NEGATIVE
Comment: NEGATIVE
Comment: NEGATIVE
Comment: NORMAL
Neisseria Gonorrhea: NEGATIVE
Trichomonas: NEGATIVE

## 2024-03-05 LAB — URINE CULTURE: Culture: >=100000 — AB

## 2024-03-08 ENCOUNTER — Ambulatory Visit: Payer: Self-pay

## 2024-04-30 ENCOUNTER — Other Ambulatory Visit: Payer: Self-pay | Admitting: Family

## 2024-04-30 ENCOUNTER — Ambulatory Visit
Admission: RE | Admit: 2024-04-30 | Discharge: 2024-04-30 | Disposition: A | Discharge status: Home / Self Care | Attending: Family | Admitting: Family

## 2024-04-30 ENCOUNTER — Ambulatory Visit
Admission: RE | Admit: 2024-04-30 | Discharge: 2024-04-30 | Disposition: A | Discharge status: Home / Self Care | Source: Ambulatory Visit | Attending: Family | Admitting: Family

## 2024-04-30 DIAGNOSIS — R079 Chest pain, unspecified: Secondary | ICD-10-CM
